# Patient Record
Sex: Male | Born: 1960 | Race: Black or African American | Hispanic: No | Marital: Single | Smoking: Never smoker
Health system: Southern US, Community
[De-identification: ages and names within clinical notes are randomized; demographics above are authoritative.]

## PROBLEM LIST (undated history)

## (undated) DIAGNOSIS — F209 Schizophrenia, unspecified: Secondary | ICD-10-CM

---

## 2003-12-07 ENCOUNTER — Emergency Department (HOSPITAL_COMMUNITY): Admission: EM | Admit: 2003-12-07 | Discharge: 2003-12-08 | Payer: Self-pay | Admitting: Emergency Medicine

## 2004-06-04 ENCOUNTER — Emergency Department (HOSPITAL_COMMUNITY): Admission: EM | Admit: 2004-06-04 | Discharge: 2004-06-04 | Payer: Self-pay | Admitting: Family Medicine

## 2007-09-16 ENCOUNTER — Ambulatory Visit: Payer: Self-pay | Admitting: Family Medicine

## 2011-08-24 ENCOUNTER — Emergency Department: Payer: Self-pay | Admitting: Emergency Medicine

## 2011-08-24 ENCOUNTER — Emergency Department: Payer: Self-pay | Admitting: *Deleted

## 2011-10-08 ENCOUNTER — Emergency Department: Payer: Self-pay | Admitting: Emergency Medicine

## 2012-10-23 IMAGING — CR DG CHEST 2V
1 series · 2 of 2 positions shown · non-contrast
Comparison: none

REASON FOR EXAM: cough
COMMENTS:

[Series 1: w chest pa · 0.14mm/px · 2 of 2 slices shown]
[im 1/2]
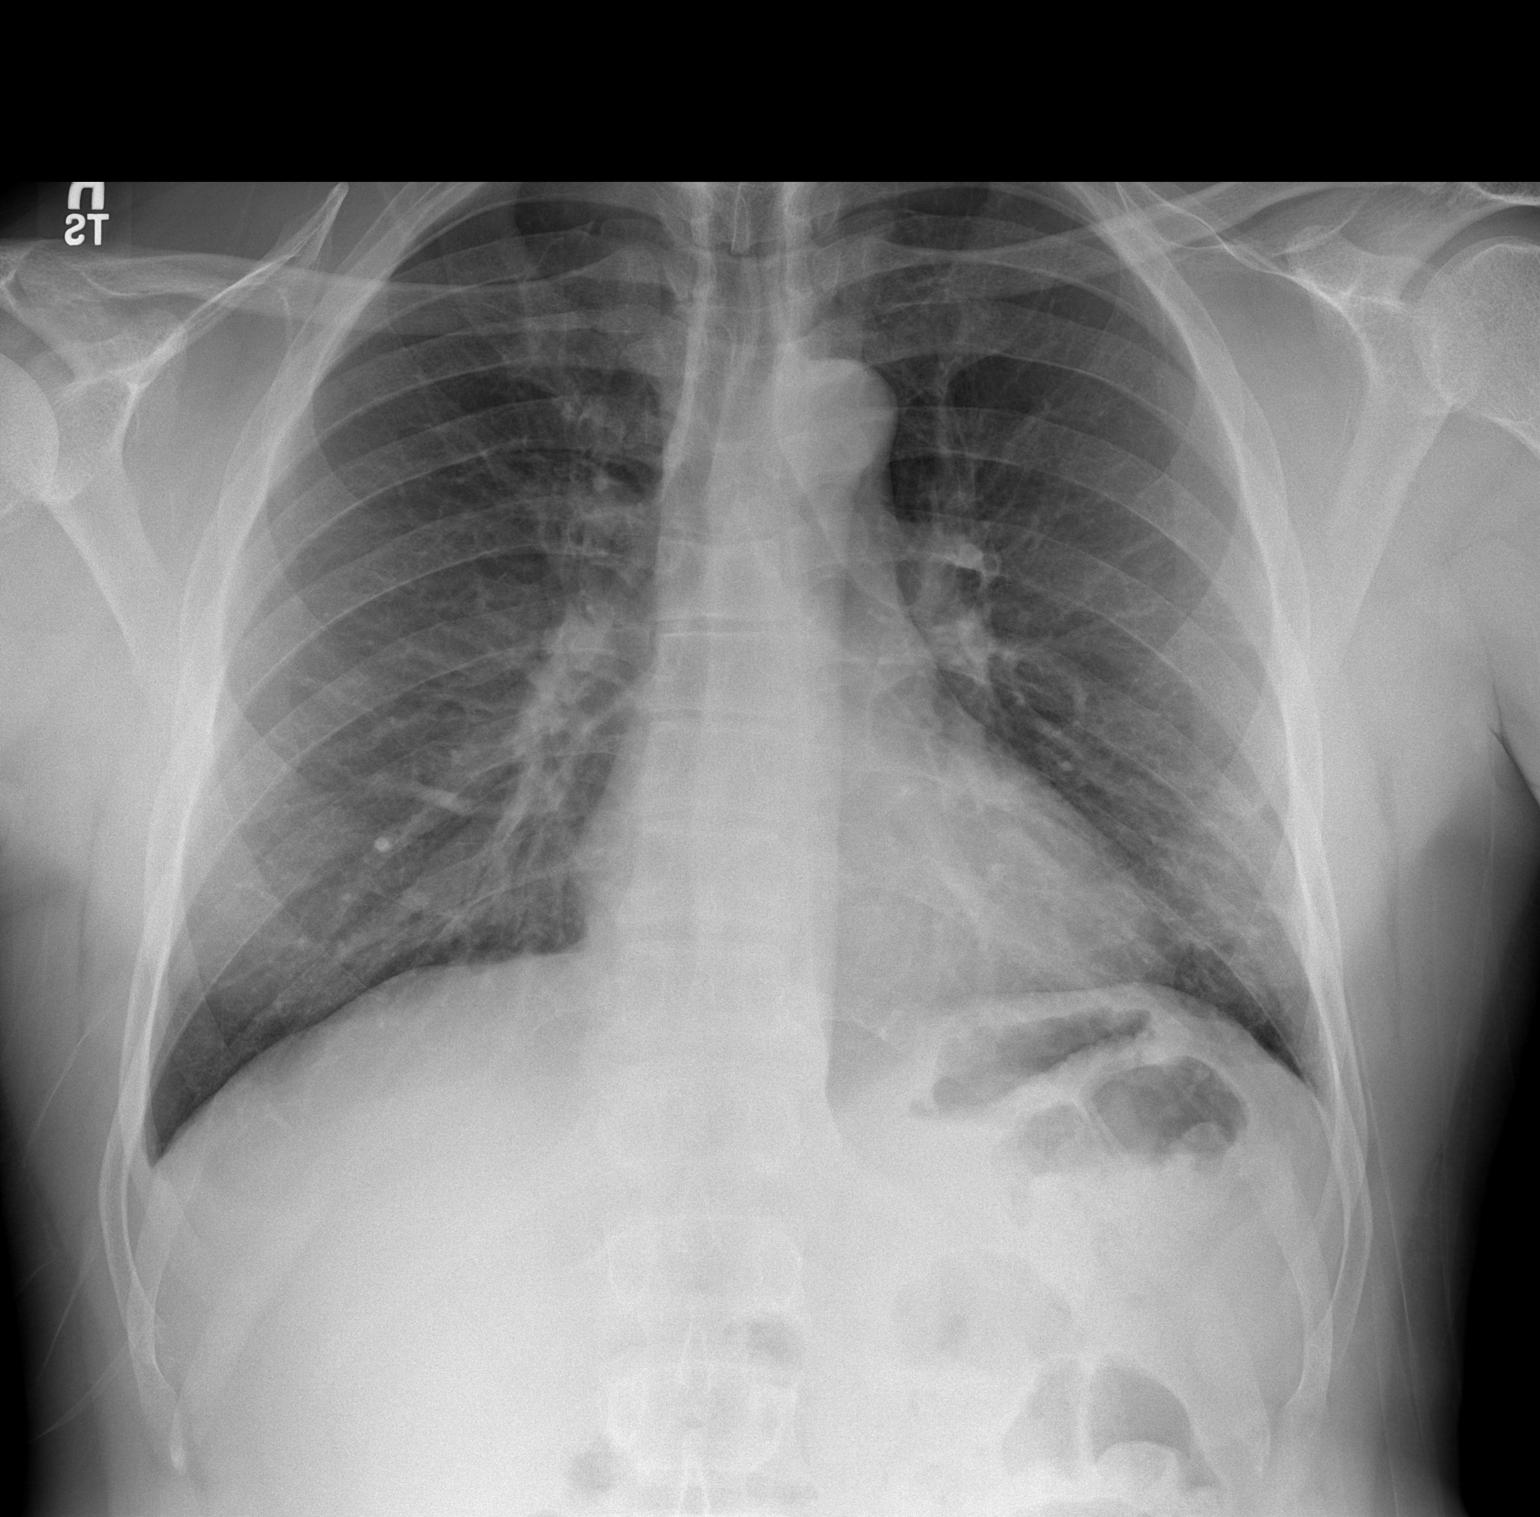
[im 2/2]
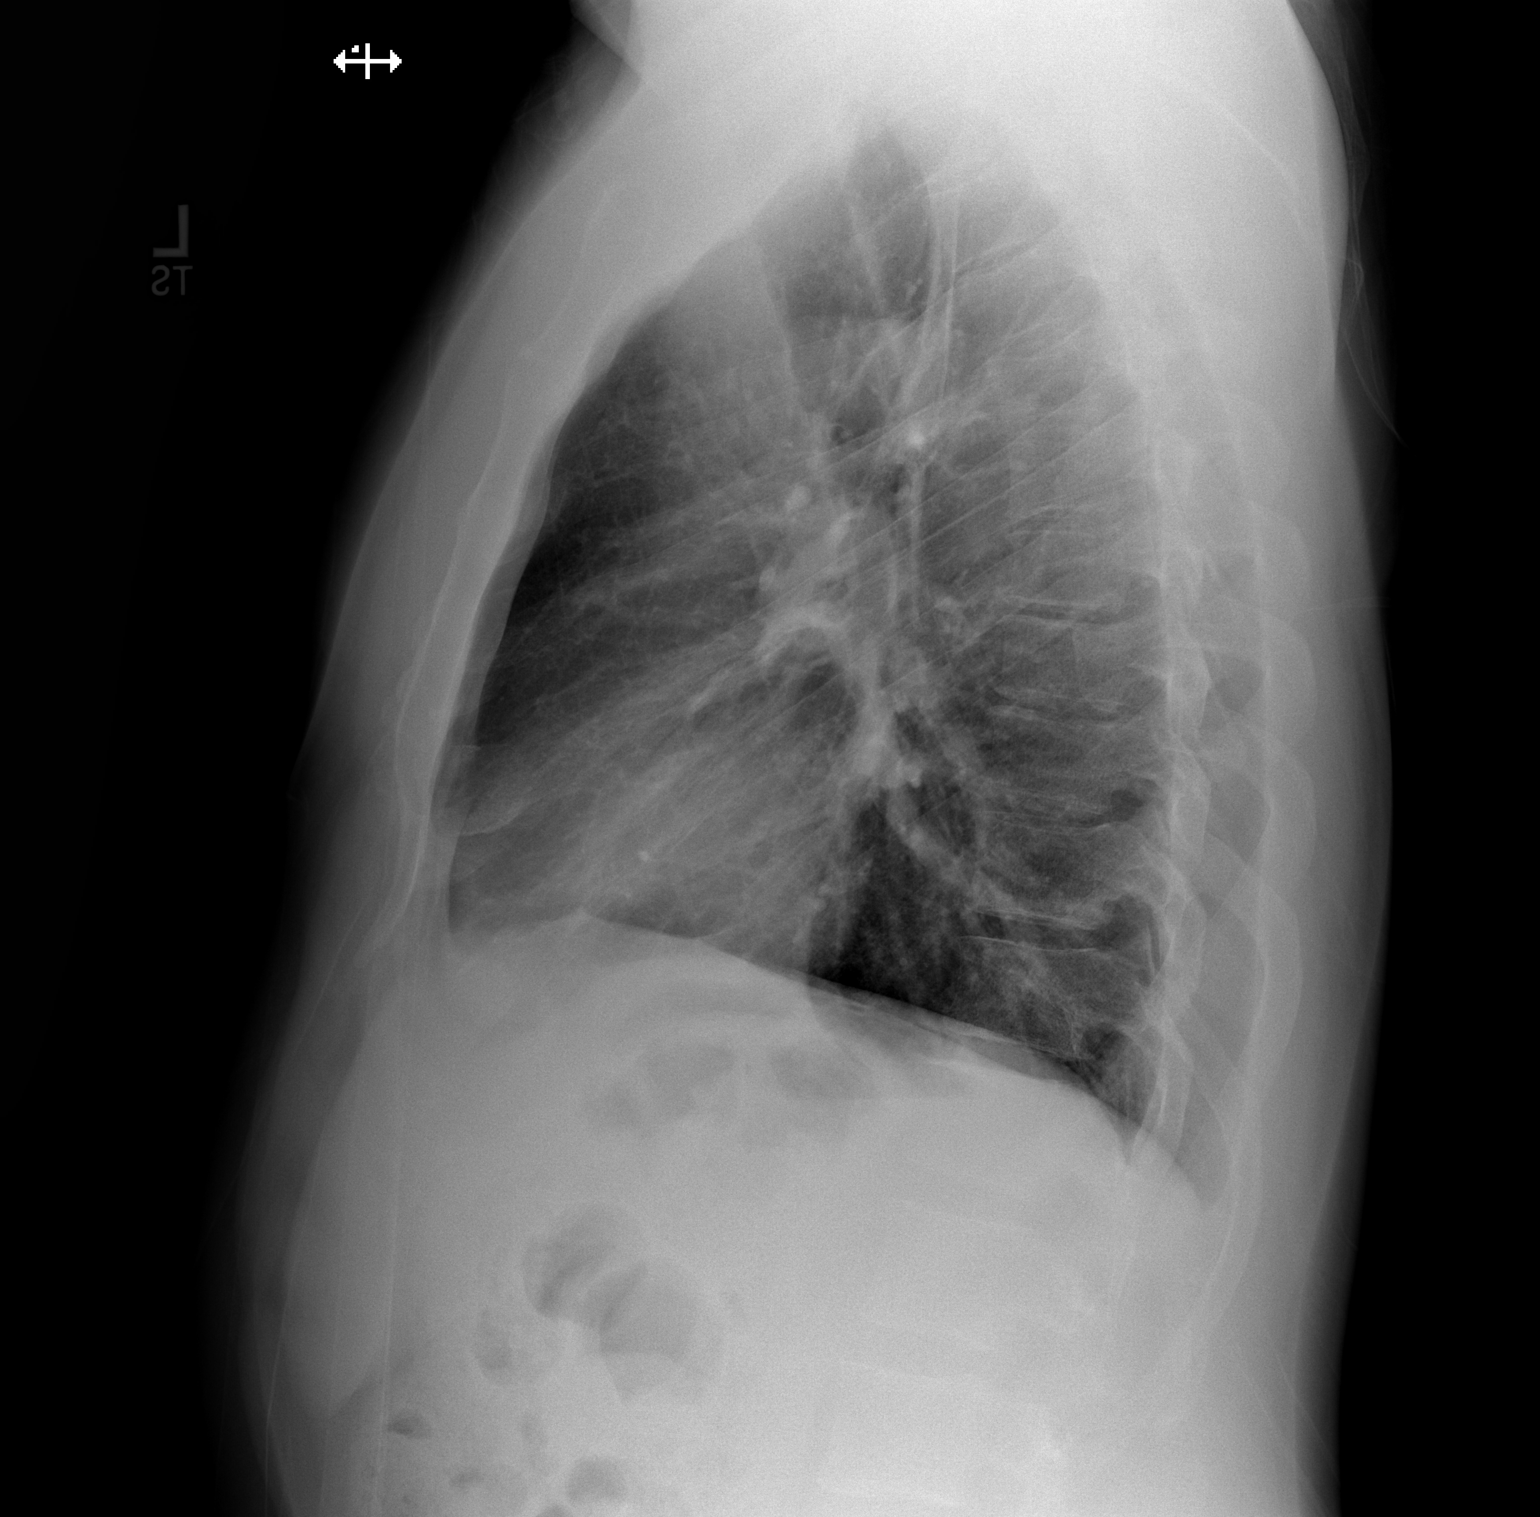

[2 of 2 positions shown; findings below may reference images not displayed]

PROCEDURE:     DXR - DXR CHEST PA (OR AP) AND LATERAL  - August 24, 2011  [DATE]

RESULT:     There is slight thickening of the left basilar markings, most
compatible with minimal discoid atelectasis. The lung fields otherwise are
clear. Heart size is normal. The mediastinal and osseous structures show no
significant abnormalities.
IMPRESSION: 1.     There is minimal atelectasis at the left base. The lung fields
otherwise are clear.

## 2017-08-17 DIAGNOSIS — D518 Other vitamin B12 deficiency anemias: Secondary | ICD-10-CM | POA: Insufficient documentation

## 2019-06-27 ENCOUNTER — Ambulatory Visit: Payer: Self-pay | Admitting: Gerontology

## 2019-06-28 ENCOUNTER — Ambulatory Visit: Payer: Medicaid Other

## 2019-07-10 ENCOUNTER — Ambulatory Visit: Payer: Self-pay | Admitting: Gerontology

## 2021-09-21 ENCOUNTER — Other Ambulatory Visit: Payer: Self-pay

## 2021-09-21 DIAGNOSIS — Z20822 Contact with and (suspected) exposure to covid-19: Secondary | ICD-10-CM | POA: Insufficient documentation

## 2021-09-21 DIAGNOSIS — F1420 Cocaine dependence, uncomplicated: Secondary | ICD-10-CM | POA: Insufficient documentation

## 2021-09-21 DIAGNOSIS — F1020 Alcohol dependence, uncomplicated: Secondary | ICD-10-CM | POA: Insufficient documentation

## 2021-09-21 DIAGNOSIS — Y900 Blood alcohol level of less than 20 mg/100 ml: Secondary | ICD-10-CM | POA: Insufficient documentation

## 2021-09-21 LAB — CBC
HCT: 39.7 % (ref 39.0–52.0)
Hemoglobin: 13.1 g/dL (ref 13.0–17.0)
MCH: 31.2 pg (ref 26.0–34.0)
MCHC: 33 g/dL (ref 30.0–36.0)
MCV: 94.5 fL (ref 80.0–100.0)
Platelets: 177 10*3/uL (ref 150–400)
RBC: 4.2 MIL/uL — ABNORMAL LOW (ref 4.22–5.81)
RDW: 14.1 % (ref 11.5–15.5)
WBC: 4.9 10*3/uL (ref 4.0–10.5)
nRBC: 0 % (ref 0.0–0.2)

## 2021-09-21 LAB — URINE DRUG SCREEN, QUALITATIVE (ARMC ONLY)
Amphetamines, Ur Screen: NOT DETECTED
Barbiturates, Ur Screen: NOT DETECTED
Benzodiazepine, Ur Scrn: NOT DETECTED
Cannabinoid 50 Ng, Ur ~~LOC~~: NOT DETECTED
Cocaine Metabolite,Ur ~~LOC~~: POSITIVE — AB
MDMA (Ecstasy)Ur Screen: NOT DETECTED
Methadone Scn, Ur: NOT DETECTED
Opiate, Ur Screen: NOT DETECTED
Phencyclidine (PCP) Ur S: NOT DETECTED
Tricyclic, Ur Screen: NOT DETECTED

## 2021-09-21 LAB — COMPREHENSIVE METABOLIC PANEL
ALT: 28 U/L (ref 0–44)
AST: 45 U/L — ABNORMAL HIGH (ref 15–41)
Albumin: 3.1 g/dL — ABNORMAL LOW (ref 3.5–5.0)
Alkaline Phosphatase: 58 U/L (ref 38–126)
Anion gap: 5 (ref 5–15)
BUN: 16 mg/dL (ref 6–20)
CO2: 26 mmol/L (ref 22–32)
Calcium: 8.5 mg/dL — ABNORMAL LOW (ref 8.9–10.3)
Chloride: 110 mmol/L (ref 98–111)
Creatinine, Ser: 1.03 mg/dL (ref 0.61–1.24)
GFR, Estimated: >60 mL/min (ref >60)
Glucose, Bld: 113 mg/dL — ABNORMAL HIGH (ref 70–99)
Potassium: 4.1 mmol/L (ref 3.5–5.1)
Sodium: 141 mmol/L (ref 135–145)
Total Bilirubin: 0.6 mg/dL (ref 0.3–1.2)
Total Protein: 5.3 g/dL — ABNORMAL LOW (ref 6.5–8.1)

## 2021-09-21 LAB — ACETAMINOPHEN LEVEL: Acetaminophen (Tylenol), Serum: <10 ug/mL — ABNORMAL LOW (ref 10–30)

## 2021-09-21 LAB — ETHANOL: Alcohol, Ethyl (B): <10 mg/dL (ref <10)

## 2021-09-21 LAB — SALICYLATE LEVEL: Salicylate Lvl: <7 mg/dL — ABNORMAL LOW (ref 7.0–30.0)

## 2021-09-21 NOTE — ED Triage Notes (Signed)
Pt to ED for detox from ETOH and cocaine.  States last use for both was last night at 2000. Pt in NAD. Denies n/v/d, tremors, anxiety, agitation, headache.   Denies SI.HI

## 2021-09-21 NOTE — ED Provider Notes (Signed)
Emergency Medicine Provider Triage Evaluation Note  Jeremy [name] , a 61 y.o. male  was evaluated in triage.  Pt requesting detox from alcohol and cocaine.  Denies withdrawal symptoms.  Review of Systems  Positive:  Negative: No chest pain, chest tightness or abdominal pain.   Physical Exam  There were no vitals taken for this visit. Gen:   Awake, no distress   Resp:  Normal effort  MSK:   Moves extremities without difficulty  Other:    Medical Decision Making  Medically screening exam initiated at 6:20 PM.  Appropriate orders placed.  Jeremy [name] was informed that the remainder of the evaluation will be completed by another provider, this initial triage assessment does not replace that evaluation, and the importance of remaining in the ED until their evaluation is complete.     Pia Mau [place], PA-C 09/21/21 Vallery Sa, MD 09/21/21 [phone]

## 2021-09-22 ENCOUNTER — Emergency Department
Admission: EM | Admit: 2021-09-22 | Discharge: 2021-09-23 | Disposition: A | Discharge status: Home / Self Care | Payer: Self-pay | Attending: Emergency Medicine | Admitting: Emergency Medicine

## 2021-09-22 DIAGNOSIS — F1410 Cocaine abuse, uncomplicated: Secondary | ICD-10-CM

## 2021-09-22 DIAGNOSIS — K029 Dental caries, unspecified: Secondary | ICD-10-CM

## 2021-09-22 LAB — RESP PANEL BY RT-PCR (FLU A&B, COVID) ARPGX2
Influenza A by PCR: NEGATIVE
Influenza B by PCR: NEGATIVE
SARS Coronavirus 2 by RT PCR: NEGATIVE

## 2021-09-22 MED ORDER — THIAMINE HCL 100 MG PO TABS
100.0000 mg | ORAL_TABLET | Freq: Every day | ORAL | Status: DC
  Administered 2021-09-23: 100 mg via ORAL
  Filled 2021-09-22: qty 1

## 2021-09-22 MED ORDER — ONDANSETRON 4 MG PO TBDP
4.0000 mg | ORAL_TABLET | Freq: Once | ORAL | Status: AC
  Administered 2021-09-22: 4 mg via ORAL
  Filled 2021-09-22: qty 1

## 2021-09-22 MED ORDER — LORAZEPAM 2 MG PO TABS: 0.0000 mg | ORAL_TABLET | Freq: Four times a day (QID) | ORAL | Status: DC

## 2021-09-22 MED ORDER — IBUPROFEN 400 MG PO TABS
400.0000 mg | ORAL_TABLET | Freq: Once | ORAL | Status: AC
Start: 2021-09-22 — End: 2021-09-22
  Administered 2021-09-22: 400 mg via ORAL
  Filled 2021-09-22: qty 1

## 2021-09-22 NOTE — ED Notes (Signed)
Pt c/o tooth pain that has returned.  EDP made aware.  Verbal order given to reorder PRN medication.
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
## 2021-09-22 NOTE — ED Provider Notes (Signed)
[facility] Provider Note    Event Date/Time   First MD Initiated Contact with Patient 09/22/21 [phone]     (approximate)   History   Detox   HPI  Jeremy [name] is a 61 y.o. male who presents to the ED from home seeking detox from alcohol and cocaine.  Reports he used both last night approximately 8 PM.  Denies active SI/HI/AH/VH.  Denies fever, cough, chest pain, shortness of breath, abdominal pain, nausea, vomiting or diarrhea.     Past Medical History  History reviewed. No pertinent past medical history.   Active Problem List  There are no problems to display for this patient.    Past Surgical History  History reviewed. No pertinent surgical history.   Home Medications   Prior to Admission medications   Not on File     Allergies  Patient has no allergy information on record.   Family History  No family history on file.   Physical Exam  Triage Vital Signs: ED Triage Vitals [09/21/21 1822]  Enc Vitals Group     BP 116/67     Pulse Rate 73     Resp 18     Temp 98.5 F (36.9 C)     Temp Source Oral     SpO2 96 %     Weight 217 lb (98.4 kg)     Height 5\' 9"  (1.753 m)     Head Circumference      Peak Flow      Pain Score 0     Pain Loc      Pain Edu?      Excl. in GC?     Updated Vital Signs: BP (!) 100/55 (BP Location: Right Arm)    Pulse 67    Temp 98 F (36.7 C) (Oral)    Resp 18    Ht 5\' 9"  (1.753 m)    Wt 98.4 kg    SpO2 97%    BMI 32.05 kg/m    General: Awake, no distress.  CV:  Good peripheral perfusion.  Resp:  Normal effort.  Abd:  No distention.  Other:  Normal affect.   ED Results / Procedures / Treatments  Labs (all labs ordered are listed, but only abnormal results are displayed) Labs Reviewed  COMPREHENSIVE METABOLIC PANEL - Abnormal; Notable for the following components:      Result Value   Glucose, Bld 113 (*)    Calcium 8.5 (*)    Total Protein 5.3 (*)    Albumin 3.1 (*)    AST 45 (*)     All other components within normal limits  SALICYLATE LEVEL - Abnormal; Notable for the following components:   Salicylate Lvl <7.0 (*)    All other components within normal limits  ACETAMINOPHEN LEVEL - Abnormal; Notable for the following components:   Acetaminophen (Tylenol), Serum <10 (*)    All other components within normal limits  CBC - Abnormal; Notable for the following components:   RBC 4.20 (*)    All other components within normal limits  URINE DRUG SCREEN, QUALITATIVE ([facility] ONLY) - Abnormal; Notable for the following components:   Cocaine Metabolite,Ur [place] POSITIVE (*)    All other components within normal limits  ETHANOL     EKG  None   RADIOLOGY None   Official radiology report(s): No results found.   PROCEDURES:  Critical Care performed: No  Procedures   MEDICATIONS ORDERED IN ED: Medications  LORazepam (ATIVAN)  tablet 0-4 mg (0 mg Oral Hold 09/22/21 0607)  thiamine tablet 100 mg (has no administration in time range)     IMPRESSION / MDM / ASSESSMENT AND PLAN / ED COURSE  I reviewed the triage vital signs and the nursing notes.                              61 year old male seeking detox from alcohol and cocaine.  He is not actively suicidal or homicidal.  Voices no medical complaints.  Laboratory results demonstrate cocaine metabolites, unremarkable EtOH.  Will place on CIWA protocol and consult TTS to evaluate patient in the emergency department.   Clinical Course as of 09/22/21 0625  Tue Sep 22, 2021  0624 TTS evaluated patient overnight and have referred him to [facility]. [JS]    Clinical Course User Index [JS] Irean Hong, MD     FINAL CLINICAL IMPRESSION(S) / ED DIAGNOSES   Final diagnoses:  Cocaine abuse (HCC)     Rx / DC Orders   ED Discharge Orders     None        Note:  This document was prepared using Dragon voice recognition software and may include unintentional dictation errors.   Irean Hong, MD 09/22/21  [phone]

## 2021-09-22 NOTE — ED Notes (Signed)
Pt requesting detox.  Pt states he does all kind of drugs.  Pt denies SI or HI.  Pt reports being homeless.  Pt in a hallway recliner.  Pt calm and cooperative.

## 2021-09-22 NOTE — ED Notes (Signed)
Breakfast tray given. °

## 2021-09-22 NOTE — BH Assessment (Signed)
Writer received call from [facility] stating they do not have any male beds available at this time.

## 2021-09-22 NOTE — ED Notes (Signed)
Voluntary detox

## 2021-09-22 NOTE — BH Assessment (Signed)
Referral information for detox/substance abuse treatment faxed to:  [facility] ([phone]-or- [phone]),   [facility] ([phone])  [facility] ([phone])  [facility] ([phone])  [facility] ([phone])    [facility] ([phone]) 

## 2021-09-22 NOTE — ED Notes (Signed)
Pt refusing Ativan at this time.  C/O tooth pain and nausea.  EDP made aware. Verbal orders given.

## 2021-09-22 NOTE — BH Assessment (Signed)
Writer spoke with patient regarding his disposition. Patient states he is needing placement for housing and does not want inpatient treatment because he wants to continue working. Patient is seeking sober living home where he can work and stay in a substance free environment. Patient needs TOC consult.

## 2021-09-22 NOTE — BH Assessment (Deleted)
Comprehensive Clinical Assessment (CCA) Note  09/22/2021 Jeremy [name] [id number]  Recommendations for Services/Supports/Treatments: Consulted with Lynder Parents., NP, who determined that pt meets inpatient psychiatric criteria. Notified Dr. Alfred Levins and Amy, RN of disposition recommendation.   Jeremy [name] is a 61 year old, English speaking, Black male with a PMH of depression due to B12 deficiency anemia and anxiety. Pt also has a hx of substance induced mood disorder, alcohol and cocaine use disorder. Pt presented to [facility] ED by BPD under IVC due to endorsing AV/H and medication noncompliance. Upon assessment, pt was standing at the door and staring intensely. Pt complied with directives to have a seat on the bed. Pt presented with I'm here for safety; I got caught doing something, and I couldn't defend myself. Pt was preoccupied throughout the assessment and asked repetitively, Is everything ok? Are you sure? Pt was paranoid and suspicious of the security guards as well as the psych team. Pt identified his main stressors as ongoing issues with his mental health in the context of hearing and seeing things. Pt reported having a hx of PTSD and TBI connected to combat. Pt explained that he is homeless and receives disability. Pt had fair insight and poor judgement. Pt is noncompliant with psychiatric medications. Pt's protective factors are being willing to ask for help. Pt's thoughts were relevant and coherent; however, pt had thought blocking and appeared to be responding to internal stimuli Pt's UDS is positive for cocaine. Pt's mood was labile and irritable; affect is blunted. Pt denied current SI and HI. Of note, pt reported having family but having no social supports   Chief Complaint: No chief complaint on file.  Visit Diagnosis: Cocaine use disorder, severe  CCA Screening, Triage and Referral (STR)  Patient Reported Information How did you hear about [place]? Self  Referral name: No data  recorded Referral phone number: No data recorded  Whom do you see for routine medical problems? No data recorded Practice/Facility Name: No data recorded Practice/Facility Phone Number: No data recorded Name of Contact: No data recorded Contact Number: No data recorded Contact Fax Number: No data recorded Prescriber Name: No data recorded Prescriber Address (if known): No data recorded  What Is the Reason for Your Visit/Call Today? Psychosis  How Long Has This Been Causing You Problems? > than 6 months  What Do You Feel Would Help You the Most Today? Treatment for Depression or other mood problem; Medication(s)   Have You Recently Been in Any Inpatient Treatment (Hospital/Detox/Crisis Center/28-Day Program)? No data recorded Name/Location of Program/Hospital:No data recorded How Long Were You There? No data recorded When Were You Discharged? No data recorded  Have You Ever Received Services From [facility] Before? No data recorded Who Do You See at [facility]? No data recorded  Have You Recently Had Any Thoughts About Hurting Yourself? No  Are You Planning to Commit Suicide/Harm Yourself At This time? No   Have you Recently Had Thoughts About [place]? No  Explanation: No data recorded  Have You Used Any Alcohol or Drugs in the Past 24 Hours? No  How Long Ago Did You Use Drugs or Alcohol? No data recorded What Did You Use and How Much? No data recorded  Do You Currently Have a Therapist/Psychiatrist? No  Name of Therapist/Psychiatrist: No data recorded  Have You Been Recently Discharged From Any Office Practice or Programs? No  Explanation of Discharge From Practice/Program: No data recorded    CCA Screening Triage Referral Assessment Type of  Contact: Face-to-Face  Is this Initial or Reassessment? No data recorded Date Telepsych consult ordered in [facility]:  No data recorded Time Telepsych consult ordered in [facility]:  No data recorded  Patient Reported  Information Reviewed? No data recorded Patient Left Without Being Seen? No data recorded Reason for Not Completing Assessment: No data recorded  Collateral Involvement: None provided   Does Patient Have a [place]? No data recorded Name and Contact of Legal Guardian: No data recorded If Minor and Not Living with Parent(s), Who has Custody? n/a  Is CPS involved or ever been involved? Never  Is APS involved or ever been involved? Never   Patient Determined To Be At Risk for Harm To Self or Others Based on Review of Patient Reported Information or Presenting Complaint? Yes, for Harm to Others  Method: No Plan  Availability of Means: No access or NA  Intent: Vague intent or NA  Notification Required: No need or identified person  Additional Information for Danger to Others Potential: Active psychosis  Additional Comments for Danger to Others Potential: Pt exhibits extreme paranoia and endorsed hearing voices that tell him things about people's bad personalities.  Are There Guns or Other Weapons in [place]? No  Types of Guns/Weapons: No data recorded Are These Weapons Safely Secured?                            No data recorded Who Could Verify You Are Able To Have These Secured: No data recorded Do You Have any Outstanding Charges, Pending Court Dates, Parole/Probation? Pt reported being caught doing something; however pt was hard to follow.  Contacted To Inform of Risk of Harm To Self or Others: No data recorded  Location of Assessment: [facility] ED   Does Patient Present under Involuntary Commitment? Yes  IVC Papers Initial File Date: 09/21/21   [place] of Residence: [place]   Patient Currently Receiving the Following Services: No data recorded  Determination of Need: Emergent (2 hours)   Options For Referral: Inpatient Hospitalization     CCA Biopsychosocial Intake/Chief Complaint:  No data recorded Current Symptoms/Problems: No data  recorded  Patient Reported Schizophrenia/Schizoaffective Diagnosis in Past: No   Strengths: Pt is able to ask for help.  Preferences: No data recorded Abilities: No data recorded  Type of Services Patient Feels are Needed: No data recorded  Initial Clinical Notes/Concerns: No data recorded  Mental Health Symptoms Depression:   None   Duration of Depressive symptoms: No data recorded  Mania:   N/A   Anxiety:    Worrying; Tension   Psychosis:   Hallucinations; Delusions   Duration of Psychotic symptoms:  Greater than six months   Trauma:   Difficulty staying/falling asleep; Hypervigilance   Obsessions:   Recurrent & persistent thoughts/impulses/images; Cause anxiety; Intrusive/time consuming   Compulsions:   N/A   Inattention:   None   Hyperactivity/Impulsivity:   None   Oppositional/Defiant Behaviors:   N/A   Emotional Irregularity:   Potentially harmful impulsivity; Recurrent suicidal behaviors/gestures/threats; Transient, stress-related paranoia/disassociation; Mood lability   Other Mood/Personality Symptoms:  No data recorded   Mental Status Exam Appearance and self-care  Stature:   Tall   Weight:   Average weight   Clothing:   -- (In scrubs)   Grooming:   Normal   Cosmetic use:   None   Posture/gait:   Normal   Motor activity:   Not Remarkable   Sensorium  Attention:   Distractible   Concentration:   Focuses on irrelevancies; Anxiety interferes; Scattered   Orientation:   Object; Person; Place; Situation   Recall/memory:   Normal   Affect and Mood  Affect:   Blunted   Mood:   Irritable   Relating  Eye contact:   Staring   Facial expression:   Tense   Attitude toward examiner:   Suspicious   Thought and Language  Speech flow:  Clear and Coherent   Thought content:   Suspicious   Preoccupation:   None   Hallucinations:   Auditory; Visual   Organization:  No data recorded  [employer] of Knowledge:   Average   Intelligence:   Average   Abstraction:   Concrete   Judgement:   Impaired   Reality Testing:   Distorted   Insight:   Fair   Decision Making:   Only simple   Social Functioning  Social Maturity:   Isolates   Social Judgement:   Impropriety   Stress  Stressors:   Housing (Current mental illness)   Coping Ability:   Exhausted   Skill Deficits:   Environmental health practitioner; Self-care   Supports:   Support needed     Religion: Religion/Spirituality Are You A Religious Person?:  (Not assessed) How Might This Affect Treatment?: n/a  Leisure/Recreation: Leisure / Recreation Do You Have Hobbies?: No  Exercise/Diet: Exercise/Diet Do You Exercise?: No Have You Gained or Lost A Significant Amount of Weight in the Past Six Months?: No Do You Follow a Special Diet?: No Do You Have Any Trouble Sleeping?: Yes Explanation of Sleeping Difficulties: Pt reported having distractions impede his ability to sleep.   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  Childhood History:  Childhood History By whom was/is the patient raised?: Mother Did patient suffer any verbal/emotional/physical/sexual abuse as a child?: No Has patient ever been sexually abused/assaulted/raped as an adolescent or adult?: No Was the patient ever a victim of a crime or a disaster?: No Witnessed domestic violence?: No Has patient been affected by domestic violence as an adult?: No  Child/Adolescent Assessment:     CCA Substance Use Alcohol/Drug Use: Alcohol / Drug Use Pain Medications: See MAR Prescriptions: See MAR Over the Counter: See MAR History of alcohol / drug use?: Yes Longest period of sobriety (when/how long): Unknown Negative Consequences of Use: Financial, Personal relationships, Legal Withdrawal Symptoms: None                         ASAM's:  Six Dimensions of Multidimensional Assessment  Dimension 1:  Acute Intoxication and/or Withdrawal Potential:   Dimension 1:  Description of  individual's past and current experiences of substance use and withdrawal: Pt has a hx of chronic cocaine abuse  Dimension 2:  Biomedical Conditions and Complications:   Dimension 2:  Description of patient's biomedical conditions and  complications: n/a  Dimension 3:  Emotional, Behavioral, or Cognitive Conditions and Complications:  Dimension 3:  Description of emotional, behavioral, or cognitive conditions and complications: Pt has a hx of PTSD and TBI  Dimension 4:  Readiness to Change:     Dimension 5:  Relapse, Continued use, or Continued Problem Potential:     Dimension 6:  Recovery/Living Environment:     ASAM Severity Score: ASAM's Severity Rating Score: 12  ASAM Recommended Level of Treatment:     Substance use Disorder (SUD) Substance Use Disorder (SUD)  Checklist Symptoms of Substance Use: Continued use despite having a persistent/recurrent physical/psychological problem caused/exacerbated by  use, Continued use despite persistent or recurrent social, interpersonal problems, caused or exacerbated by use, Recurrent use that results in a failure to fulfill major role obligations (work, school, home), Social, occupational, recreational activities given up or reduced due to use  Recommendations for Services/Supports/Treatments: Recommendations for Services/Supports/Treatments Recommendations For Services/Supports/Treatments: Individual Therapy  DSM5 Diagnoses: There are no problems to display for this patient.     [place]

## 2021-09-22 NOTE — ED Notes (Addendum)
Pt c/o jaw and mouth pain. MD messaged.

## 2021-09-23 MED ORDER — PENICILLIN V POTASSIUM 250 MG PO TABS
500.0000 mg | ORAL_TABLET | Freq: Four times a day (QID) | ORAL | Status: DC
  Administered 2021-09-23 (×2): 500 mg via ORAL
  Filled 2021-09-23: qty 2

## 2021-09-23 MED ORDER — IBUPROFEN 600 MG PO TABS
ORAL_TABLET | ORAL | Status: AC
  Filled 2021-09-23: qty 1

## 2021-09-23 MED ORDER — IBUPROFEN 800 MG PO TABS
800.0000 mg | ORAL_TABLET | Freq: Four times a day (QID) | ORAL | Status: DC | PRN
  Administered 2021-09-23: 800 mg via ORAL

## 2021-09-23 MED ORDER — PENICILLIN V POTASSIUM 250 MG PO TABS
ORAL_TABLET | ORAL | Status: AC
  Filled 2021-09-23: qty 1

## 2021-09-23 MED ORDER — IBUPROFEN 800 MG PO TABS
ORAL_TABLET | ORAL | Status: AC
  Filled 2021-09-23: qty 1

## 2021-09-23 NOTE — ED Notes (Signed)
Breafast tray given. Warm blanket provided. No other needs found.

## 2021-09-23 NOTE — ED Notes (Signed)
Pt requesting pain medication due to toothache, left side. EDP at bedside to assess.

## 2021-09-23 NOTE — ED Notes (Signed)
Given resources by case management. Pt took all of belongings and ambulated to lobby. Instructed that discharge papers are not ready at this time, and updated EDP. Pt states that he is not going to wait around on them. Pt announced leaving.

## 2021-09-23 NOTE — ED Provider Notes (Addendum)
Today's Vitals   09/22/21 0042 09/22/21 0325 09/22/21 1239 09/23/21 0230  BP: (!) 100/55  128/73 128/73  Pulse: 67  79 74  Resp: 18  18   Temp: 98 F (36.7 C)  98.2 F (36.8 C)   TempSrc: Oral  Oral   SpO2: 97%  97%   Weight:      Height:      PainSc:  0-No pain 7     Body mass index is 32.05 kg/m.   Patient resting comfortably.  No acute events overnight.  Awaiting social work disposition.  Patient initially requesting detox but does not want inpatient treatment per counselor note yesterday.  Seeking sober living home.  Reportedly needs placement for housing.   Alisha Bacus, Layla Maw, DO 09/23/21 9735   5:35 AM  Pt complaining of left lower dental pain.  Has multiple dental caries on exam without sign of drainable abscess.  No sign of facial cellulitis, Ludwig's angina.  Normal phonation.  No trismus or drooling.  We will start him on prophylactic antibiotics and I have ordered a standing order for ibuprofen given this appears to have helped his pain previously.  If discharged, patient will need a prescription for antibiotics.  Dental resources have been placed in his paperwork already.   Brailyn Killion, Layla Maw, DO 09/23/21 [phone]

## 2021-09-23 NOTE — TOC Transition Note (Signed)
Transition of Care [facility]) - CM/SW Discharge Note   Patient Details  Name: Jeremy [name] MRN: [id number] Date of Birth: [birth year]  [facility] CM/SW Contact:  Shelbie Hutching, RN Phone Number: 09/23/2021, 1:07 PM   Clinical Narrative:    Patient came into the emergency department for detox from alcohol and cocaine.  RNCM met with patient at the bedside.  Patient is very pleasant and understanding.  RNCM reached out to [employer] and left a message for a return call.  RNCM also reached out to [facility], patient has already called them and is on a wait list.  Patient does not want to go to [employer] or [employer] or [facility] [employer] in [place].  He reports that he has a job in [employer] a couple days a week and needs to stay in town.  Patient declines written copy of resources, he says he knows them all. Patient will go to the [employer] today, he has a ride coming at 2.  He reports he will wait in the lobby until they arrive.     Final next level of care: Homeless Shelter Barriers to Discharge: Barriers Resolved   Patient Goals and CMS Choice Patient states their goals for this hospitalization and ongoing recovery are:: Patient was looking to get into the shelter or sober living- needs to stay in [place]      Discharge Placement                       Discharge Plan and Services In-house Referral: Clinical Social Work Discharge Planning Services: CM Consult            DME Arranged: N/A DME Agency: NA       HH Arranged: NA [place] Agency: NA        Social Determinants of Health (SDOH) Interventions     Readmission Risk Interventions No flowsheet data found.

## 2021-09-23 NOTE — Discharge Instructions (Signed)
You may alternate Tylenol 1000 mg every 6 hours as needed for pain, fever and Ibuprofen 800 mg every 8 hours as needed for pain, fever.  Please take Ibuprofen with food.  Do not take more than 4000 mg of Tylenol (acetaminophen) in a 24 hour period.  

## 2021-09-23 NOTE — ED Notes (Signed)
Hospital meal provided.  100% consumed, pt tolerated w/o complaints.  Waste discarded appropriately.   

## 2021-12-20 ENCOUNTER — Encounter: Payer: Self-pay | Admitting: Emergency Medicine

## 2021-12-20 ENCOUNTER — Emergency Department
Admission: EM | Admit: 2021-12-20 | Discharge: 2021-12-21 | Disposition: A | Discharge status: Other Acute Inpatient Hospital | Payer: 59 | Attending: Emergency Medicine | Admitting: Emergency Medicine

## 2021-12-20 ENCOUNTER — Other Ambulatory Visit: Payer: Self-pay

## 2021-12-20 DIAGNOSIS — R7989 Other specified abnormal findings of blood chemistry: Secondary | ICD-10-CM | POA: Insufficient documentation

## 2021-12-20 DIAGNOSIS — R748 Abnormal levels of other serum enzymes: Secondary | ICD-10-CM

## 2021-12-20 DIAGNOSIS — Z20822 Contact with and (suspected) exposure to covid-19: Secondary | ICD-10-CM | POA: Insufficient documentation

## 2021-12-20 DIAGNOSIS — F14951 Cocaine use, unspecified with cocaine-induced psychotic disorder with hallucinations: Secondary | ICD-10-CM | POA: Diagnosis present

## 2021-12-20 DIAGNOSIS — R440 Auditory hallucinations: Secondary | ICD-10-CM | POA: Insufficient documentation

## 2021-12-20 LAB — CBC
HCT: 47.6 % (ref 39.0–52.0)
Hemoglobin: 15.5 g/dL (ref 13.0–17.0)
MCH: 30.8 pg (ref 26.0–34.0)
MCHC: 32.6 g/dL (ref 30.0–36.0)
MCV: 94.4 fL (ref 80.0–100.0)
Platelets: 284 10*3/uL (ref 150–400)
RBC: 5.04 MIL/uL (ref 4.22–5.81)
RDW: 13.9 % (ref 11.5–15.5)
WBC: 9.4 10*3/uL (ref 4.0–10.5)
nRBC: 0 % (ref 0.0–0.2)

## 2021-12-20 LAB — ETHANOL: Alcohol, Ethyl (B): <10 mg/dL (ref <10)

## 2021-12-20 LAB — COMPREHENSIVE METABOLIC PANEL
ALT: 25 U/L (ref 0–44)
AST: 37 U/L (ref 15–41)
Albumin: 4.2 g/dL (ref 3.5–5.0)
Alkaline Phosphatase: 85 U/L (ref 38–126)
Anion gap: 11 (ref 5–15)
BUN: 16 mg/dL (ref 6–20)
CO2: 25 mmol/L (ref 22–32)
Calcium: 9.5 mg/dL (ref 8.9–10.3)
Chloride: 109 mmol/L (ref 98–111)
Creatinine, Ser: 1 mg/dL (ref 0.61–1.24)
GFR, Estimated: >60 mL/min (ref >60)
Glucose, Bld: 106 mg/dL — ABNORMAL HIGH (ref 70–99)
Potassium: 3.7 mmol/L (ref 3.5–5.1)
Sodium: 145 mmol/L (ref 135–145)
Total Bilirubin: 1.4 mg/dL — ABNORMAL HIGH (ref 0.3–1.2)
Total Protein: 7.1 g/dL (ref 6.5–8.1)

## 2021-12-20 LAB — CK
Total CK: 792 U/L — ABNORMAL HIGH (ref 49–397)
Total CK: 533 U/L — ABNORMAL HIGH (ref 49–397)

## 2021-12-20 LAB — ACETAMINOPHEN LEVEL: Acetaminophen (Tylenol), Serum: <10 ug/mL — ABNORMAL LOW (ref 10–30)

## 2021-12-20 LAB — SALICYLATE LEVEL: Salicylate Lvl: <7 mg/dL — ABNORMAL LOW (ref 7.0–30.0)

## 2021-12-20 MED ORDER — BENZTROPINE MESYLATE 1 MG PO TABS
0.5000 mg | ORAL_TABLET | Freq: Every day | ORAL | Status: DC
  Administered 2021-12-20 – 2021-12-21 (×2): 0.5 mg via ORAL
  Filled 2021-12-20 (×2): qty 1

## 2021-12-20 MED ORDER — SODIUM CHLORIDE 0.9 % IV BOLUS
1000.0000 mL | Freq: Once | INTRAVENOUS | Status: AC
Start: 2021-12-20 — End: 2021-12-20
  Administered 2021-12-20: 1000 mL via INTRAVENOUS

## 2021-12-20 MED ORDER — HALOPERIDOL 0.5 MG PO TABS
2.0000 mg | ORAL_TABLET | Freq: Two times a day (BID) | ORAL | Status: DC
  Administered 2021-12-20 – 2021-12-21 (×3): 2 mg via ORAL
  Filled 2021-12-20 (×2): qty 4
  Filled 2021-12-20: qty 1

## 2021-12-20 NOTE — ED Notes (Addendum)
Pt came in holding a wad of cash asking to buy something to eat.  Pt is shivering and is unable to hold on to his belongings.  Pt had 447.00 in cash.  4 - 100.00 bills, 1 - 20.00 bill, 1 - 10.00 bill, 2 - 5.00 bills and 7- 1.00 BILLS.  Cash was counted by patient, myself and Engineer, production, placed in cash envelope, signed by patient, myself, Engineer, production, and [employer].  Security took envelope to safe and will return with form and key.  Pt is aware that security has his money locked up and he will retrieve it upon discharge.  Envelope number is [id number] ?

## 2021-12-20 NOTE — ED Provider Notes (Signed)
? ?[facility] ?Provider Note ? ? ? Event Date/Time  ? First MD Initiated Contact with Patient 12/20/21 [phone]   ?  (approximate) ? ? ?History  ? ?Cold Exposure ? ? ?HPI ? ?Jeremy [name] is a 61 y.o. male who self-reports a prior diagnosis of schizoaffective disorder.  He presents by [employer] police voluntarily.  Reportedly he was found outside in the cold.  He states that he started walking yesterday afternoon around 4 PM from [facility] to get to [place].  He says that the reason he was doing so was that he hears voices.  He has been hearing them for a long time and he was told a year ago that he has schizoaffective disorder by doctor at [facility].  However he does not take any medications and usually it is not a problem.  However he states that over the last few weeks and especially the last few days the voices have been getting louder and they are telling him to hurt other people.  He states that he does not want to hurt anyone, however, so he decided to walk to [facility] for help. ? ?He is shivering and cold and states that his feet hurt from walking so much.  He is afraid other people want to hurt him and was exhibiting some signs of paranoia to the Designer, television/film set.  However for the emergency department staff he is calm, polite, and respectful, following instructions and acting appropriately. ? ?He denies chest pain, abdominal pain, shortness of breath, nausea, vomiting. ?  ? ? ?Physical Exam  ? ?Triage Vital Signs: ?ED Triage Vitals  ?Enc Vitals Group  ?   BP 12/20/21 0523 (!) 125/94  ?   Pulse Rate 12/20/21 0523 85  ?   Resp 12/20/21 0523 20  ?   Temp 12/20/21 0523 (!) 97.5 ?F (36.4 ?C)  ?   Temp Source 12/20/21 0523 Oral  ?   SpO2 12/20/21 0523 95 %  ?   Weight 12/20/21 0522 98.4 kg (216 lb 14.9 oz)  ?   Height 12/20/21 0522 1.753 m (5\' 9" )  ?   Head Circumference --   ?   Peak Flow --   ?   Pain Score 12/20/21 0522 10  ?   Pain Loc --   ?   Pain Edu? --   ?   Excl. in  GC? --   ? ? ?Most recent vital signs: ?Vitals:  ? 12/20/21 0730 12/20/21 0800  ?BP: 124/71 131/73  ?Pulse: 75 71  ?Resp: 11 11  ?Temp:    ?SpO2: 95% 100%  ? ? ? ?General: Awake, no distress.  ?CV:  Good peripheral perfusion.  He was very cold to the touch upon arrival from being outside but had a rectal temperature of 98.2 degrees.  He was placed on the bear hugger for comfort but has good distal perfusion and palpable peripheral pulses. ?Resp:  Normal effort.  Lungs are clear to auscultation. ?Abd:  No distention.  No tenderness to palpation of the abdomen. ?Other:  Erythema to the bottoms of the feet, no visible blisters at this time but they may develop. ? ? ?ED Results / Procedures / Treatments  ? ?Labs ?(all labs ordered are listed, but only abnormal results are displayed) ?Labs Reviewed  ?COMPREHENSIVE METABOLIC PANEL - Abnormal; Notable for the following components:  ?    Result Value  ? Glucose, Bld 106 (*)   ? Total Bilirubin 1.4 (*)   ?  All other components within normal limits  ?SALICYLATE LEVEL - Abnormal; Notable for the following components:  ? Salicylate Lvl <7.0 (*)   ? All other components within normal limits  ?ACETAMINOPHEN LEVEL - Abnormal; Notable for the following components:  ? Acetaminophen (Tylenol), Serum <10 (*)   ? All other components within normal limits  ?CK - Abnormal; Notable for the following components:  ? Total CK 792 (*)   ? All other components within normal limits  ?ETHANOL  ?CBC  ?URINE DRUG SCREEN, QUALITATIVE ([facility] ONLY)  ?CK  ? ? ? ?PROCEDURES: ? ?Critical Care performed: No ? ?Procedures ? ? ?MEDICATIONS ORDERED IN ED: ?Medications  ?sodium chloride 0.9 % bolus 1,000 mL (1,000 mLs Intravenous New Bag/Given 12/20/21 0728)  ? ? ? ?IMPRESSION / MDM / ASSESSMENT AND PLAN / ED COURSE  ?I reviewed the triage vital signs and the nursing notes. ?             ?               ? ?Differential diagnosis includes, but is not limited to, schizoaffective disorder, schizophrenia, other  nonspecific psychiatric illness, metabolic or electrolyte abnormality, rhabdomyolysis, medication or drug side effect. ? ?The patient is acting very appropriate and calm and cooperative with [place].  Although he states that he is having auditory command hallucinations, he says he does not want to harm anyone else and he wants help.  I will not place him under involuntary commitment at this time.  ? ?Standard psychiatric lab work was ordered, including urine drug screen, salicylate level, acetaminophen level, ethanol level, comprehensive metabolic panel, and CBC.  I also added on a CK given his reported degree of exertion. ? ?I reviewed his results and his CBC is normal, CMP is normal, ethanol level negative, acetaminophen level negative, salicylate level negative.  However his CK is elevated at 792.  I ordered 1 L LR IV bolus and the patient will need a recheck CK level. ? ?The patient also needs a psychiatric evaluation.  He can be medically cleared if his CK is not going up after the IV fluids. ? ?I discussed the case in person with Dr. Sidney Ace who is taking over ED care and will follow up on the CK level and on psych management recommendations. ? ? ? ? ?  ? ? ?FINAL CLINICAL IMPRESSION(S) / ED DIAGNOSES  ? ?Final diagnoses:  ?Auditory hallucinations  ?Elevated CK  ? ? ? ?Rx / DC Orders  ? ?ED Discharge Orders   ? ? None  ? ?  ? ? ? ?Note:  This document was prepared using Dragon voice recognition software and may include unintentional dictation errors. ?  ?Loleta Rose, MD ?12/20/21 [phone] ? ?

## 2021-12-20 NOTE — ED Provider Notes (Signed)
Patient signed out to me at 8 AM.  61 year old male likely with history of schizophrenia with hallucinations and walking all night.  He is pending a liter of fluid and repeat of his CK as well as psychiatry consult. ? ?After fluids CK is downtrending and not significantly elevated. ? ?Patient evaluated by psychiatry and they will hold him overnight to see how he responds to Cogentin.  Will place in the BHU. ?  ?Georga Hacking, MD ?12/20/21 1640 ? ?

## 2021-12-20 NOTE — ED Triage Notes (Signed)
Pt to ED via BPD, reports patient was found outside. Pt reports has been walking since 1600 yesterday from [facility] to [place].  ? ?Pt noted to be paranoid in triage, states people were following him up the road and states that he has thoughts about hurting him. Pt noted to be extremely cold to touch, shivering constantly in triage.  ?

## 2021-12-20 NOTE — ED Notes (Signed)
Snack and beverage given. 

## 2021-12-20 NOTE — ED Notes (Signed)
Breakfast tray at bedside 

## 2021-12-20 NOTE — Consult Note (Signed)
[facility] Face-to-Face Psychiatry Consult  ? 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 ? ?Total Time spent with patient: 45 minutes ? ?Subjective:   ?Jeremy [name] is a 61 y.o. male patient admitted with cocaine abuse with hallucinations. ? ?HPI:  61 yo male presents after his mother dropped him off in [place] for his friend to take him to [facility]  Evidently,  his friend did not come and he walked to [facility].  He reports using crack/cocaine daily about $30-$40 dollars worth and "hear people outside my window.  If I look I see people even far away.  So I don't look and don't see them."  He reports these symptoms occurring for the past year and his cocaine use being steady over the course of the past 4 months.  However, per the chart review, similar presentation at [facility] in 01/2021 with reports of using cocaine over the past year.  Denies suicidal/homicidal ideations and withdrawal symptoms.  He reported paranoia last night, none today.  Explained Haldol would be started to assist his symptoms and until he refrains from cocaine abuse his symptoms would most likely continue and/or increase.  He is agreeable to follow up with outpatient substance abuse services with [facility].  Haldol will be started and considered for discharge tomorrow after reassessment.  This plan was communicated to him. ? ?Caveat:  Court date on 4/17 for probation violation in [facility] for moving out of the county.  Resources may need to be geared to [facility] at the [facility] if this is the case.  TTS notified. ? ?Past Psychiatric History: cocaine/crack abuse  ? ?Risk to Self:  none ?Risk to Others:  none ?Prior Inpatient Therapy:  unc  ?Prior Outpatient Therapy:   none ? ?Past Medical History: History reviewed. No pertinent past medical history. History reviewed. No pertinent surgical history. ?Family History: History reviewed. No pertinent family history. ?Family Psychiatric  History: none ?Social History:  ?Social History  ? ?Substance and Sexual Activity  ?Alcohol Use Yes  ?   ?Social History  ? ?Substance and Sexual Activity  ?Drug Use Yes  ? Types: Cocaine  ?  ?Social History  ? ?Socioeconomic History  ? Marital status: Single  ?  Spouse name: Not on file  ? Number of children: Not on file  ? Years of education: Not on file  ? Highest education level: Not on file  ?Occupational History  ? Not on file  ?Tobacco Use  ? Smoking status: Never  ? Smokeless tobacco: Never  ?Substance and Sexual Activity  ? Alcohol use: Yes  ? Drug use: Yes  ?  Types: Cocaine  ? Sexual activity: Not on file  ?Other Topics Concern  ? Not on file  ?Social History Narrative  ? Not on file  ? ?Social Determinants of Health  ? ?Financial Resource Strain: Not on file  ?Food Insecurity: Not on file  ?Transportation Needs: Not on file  ?Physical Activity: Not on file  ?Stress: Not on file  ?Social Connections: Not on file  ? ?Additional Social History: ?  ? ?Allergies:  No Known Allergies ? ?Labs:  ?Results for orders placed or performed during the hospital encounter of 12/20/21 (from the past 48 hour(s))  ?Comprehensive metabolic panel     Status: Abnormal  ? Collection Time:  12/20/21  5:34 AM  ?Result Value Ref Range  ? Sodium 145 135 - 145 mmol/L  ? Potassium 3.7 3.5 - 5.1 mmol/L  ? Chloride 109 98 - 111 mmol/L  ? CO2 25 22 - 32 mmol/L  ? Glucose, Bld 106 (H) 70 - 99 mg/dL  ?  Comment: Glucose reference range applies only to samples taken after fasting for at least 8 hours.  ? BUN 16 6 - 20 mg/dL  ? Creatinine, Ser 1.00 0.61 - 1.24 mg/dL  ? Calcium 9.5 8.9 - 10.3 mg/dL  ? Total Protein 7.1 6.5 - 8.1 g/dL  ? Albumin 4.2 3.5 - 5.0 g/dL  ? AST 37 15 - 41 U/L  ? ALT 25 0 - 44 U/L  ? Alkaline Phosphatase  85 38 - 126 U/L  ? Total Bilirubin 1.4 (H) 0.3 - 1.2 mg/dL  ? GFR, Estimated >60 >60 mL/min  ?  Comment: (NOTE) ?Calculated using the CKD-EPI Creatinine Equation (2021) ?  ? Anion gap 11 5 - 15  ?  Comment: Performed at [facility], [address], [place], [place] [address]  ?Ethanol     Status: None  ? Collection Time: 12/20/21  5:34 AM  ?Result Value Ref Range  ? Alcohol, Ethyl (B) <10 <10 mg/dL  ?  Comment: (NOTE) ?Lowest detectable limit for serum alcohol is 10 mg/dL. ? ?For medical purposes only. ?Performed at [facility], [place], ?[place] [address] ?  ?Salicylate level     Status: Abnormal  ? Collection Time: 12/20/21  5:34 AM  ?Result Value Ref Range  ? Salicylate Lvl Q000111Q (L) 7.0 - 30.0 mg/dL  ?  Comment: Performed at [facility], [address] [place], [place] [address]  ?Acetaminophen level     Status: Abnormal  ? Collection Time: 12/20/21  5:34 AM  ?Result Value Ref Range  ? Acetaminophen (Tylenol), Serum <10 (L) 10 - 30 ug/mL  ?  Comment: (NOTE) ?Therapeutic concentrations vary significantly. A range of 10-30 ug/mL  ?may be an effective concentration for many patients. However, some  ?are best treated at concentrations outside of this range. ?Acetaminophen concentrations >150 ug/mL at 4 hours after ingestion  ?and >50 ug/mL at 12 hours after ingestion are often associated with  ?toxic reactions. ? ?Performed at [facility], [place], ?[place] [address] ?  ?cbc     Status: None  ? Collection Time: 12/20/21  5:34 AM  ?Result Value Ref Range  ? WBC 9.4 4.0 - 10.5 K/uL  ? RBC 5.04 4.22 - 5.81 MIL/uL  ? Hemoglobin 15.5 13.0 - 17.0 g/dL  ? HCT 47.6 39.0 - 52.0 %  ? MCV 94.4 80.0 - 100.0 fL  ? MCH 30.8 26.0 - 34.0 pg  ? MCHC 32.6 30.0 - 36.0 g/dL  ? RDW 13.9 11.5 - 15.5 %  ? Platelets 284 150 - 400 K/uL  ? nRBC 0.0 0.0 - 0.2 %  ?  Comment: Performed at [facility], [address], [place], [place] [address]  ?CK      Status: Abnormal  ? Collection Time: 12/20/21  5:34 AM  ?Result Value Ref Range  ? Total CK 792 (H) 49 - 397 U/L  ?  Comment: Performed at [facility], [address], [place], [place] [address]  ? ? ?No current facility-administered medications for this encounter.  ? ?No current outpatient medications on file.  ? ? ?Musculoskeletal: ?Strength & Muscle Tone: within  normal limits ?Gait & Station: normal ?Patient leans: N/A ? ?Psychiatric Specialty Exam: ?Physical Exam ?Vitals and nursing note reviewed.  ?Constitutional:   ?   Appearance: Normal appearance.  ?HENT:  ?   Head: Normocephalic.  ?   Nose: Nose normal.  ?Pulmonary:  ?   Effort: Pulmonary effort is normal.  ?Musculoskeletal:     ?   General: Normal range of motion.  ?   Cervical back: Normal range of motion.  ?Neurological:  ?   General: No focal deficit present.  ?   Mental Status: He is alert and oriented to person, place, and time.  ?Psychiatric:     ?   Attention and Perception: Attention normal. He perceives visual hallucinations.     ?   Mood and Affect: Mood is anxious.     ?   Speech: Speech normal.     ?   Behavior: Behavior normal. Behavior is cooperative.     ?   Thought Content: Thought content normal.     ?   Cognition and Memory: Cognition and memory normal.     ?   Judgment: Judgment normal.  ?  ?Review of Systems  ?Constitutional:  Positive for malaise/fatigue.  ?Psychiatric/Behavioral:  Positive for hallucinations and substance abuse. The patient is nervous/anxious.   ?All other systems reviewed and are negative.  ?Blood pressure 119/72, pulse 66, temperature 98.2 ?F (36.8 ?C), temperature source Rectal, resp. rate 13, height 5\' 9"  (1.753 m), weight 98.4 kg, SpO2 98 %.Body mass index is 32.04 kg/m?.  ?General Appearance: Casual  ?Eye Contact:  Good  ?Speech:  Normal Rate  ?Volume:  Normal  ?Mood:  Anxious  ?Affect:  Congruent  ?Thought Process:  Coherent and Descriptions of Associations: Intact  ?Orientation:  Full (Time, Place,  and Person)  ?Thought Content:  WDL and Logical  ?Suicidal Thoughts:  No  ?Homicidal Thoughts:  No  ?Memory:  Immediate;   Fair ?Recent;   Fair ?Remote;   Fair  ?Judgement:  Fair  ?Insight:  Fair  ?Psychomotor Marya Fossa

## 2021-12-20 NOTE — ED Notes (Signed)
Walked pt around hallway. Has pain in feet but is steady. Dressed into blue scrubs.  ?

## 2021-12-20 NOTE — ED Notes (Signed)
Patient given ginger ale, crackers, and peanut butter at this time. ?

## 2021-12-20 NOTE — ED Notes (Signed)
Report to include Situation, Background, Assessment, and Recommendations received from Annie RN. Patient alert and oriented, warm and dry, in no acute distress. Patient denies SI, HI, AVH and pain. Patient made aware of Q15 minute rounds and security cameras for their safety. Patient instructed to come to me with needs or concerns. ? ?

## 2021-12-20 NOTE — ED Notes (Signed)
Patient resting comfortably in stretcher. Nad noted. ?

## 2021-12-21 ENCOUNTER — Encounter: Payer: Self-pay | Admitting: Psychiatry

## 2021-12-21 ENCOUNTER — Inpatient Hospital Stay
Admission: AD | Admit: 2021-12-21 | Discharge: 2021-12-25 | DRG: 897 | Disposition: A | Discharge status: Home / Self Care | Payer: No Typology Code available for payment source | Source: Intra-hospital | Attending: Psychiatry | Admitting: Psychiatry

## 2021-12-21 ENCOUNTER — Other Ambulatory Visit: Payer: Self-pay

## 2021-12-21 DIAGNOSIS — F319 Bipolar disorder, unspecified: Secondary | ICD-10-CM | POA: Diagnosis present

## 2021-12-21 DIAGNOSIS — F419 Anxiety disorder, unspecified: Secondary | ICD-10-CM | POA: Diagnosis present

## 2021-12-21 DIAGNOSIS — G4700 Insomnia, unspecified: Secondary | ICD-10-CM | POA: Diagnosis present

## 2021-12-21 DIAGNOSIS — F1994 Other psychoactive substance use, unspecified with psychoactive substance-induced mood disorder: Secondary | ICD-10-CM | POA: Diagnosis present

## 2021-12-21 DIAGNOSIS — F29 Unspecified psychosis not due to a substance or known physiological condition: Secondary | ICD-10-CM

## 2021-12-21 DIAGNOSIS — Z5900 Homelessness unspecified: Secondary | ICD-10-CM | POA: Diagnosis not present

## 2021-12-21 DIAGNOSIS — F14151 Cocaine abuse with cocaine-induced psychotic disorder with hallucinations: Secondary | ICD-10-CM | POA: Diagnosis present

## 2021-12-21 DIAGNOSIS — F14951 Cocaine use, unspecified with cocaine-induced psychotic disorder with hallucinations: Secondary | ICD-10-CM | POA: Diagnosis not present

## 2021-12-21 DIAGNOSIS — F1410 Cocaine abuse, uncomplicated: Secondary | ICD-10-CM

## 2021-12-21 DIAGNOSIS — F15159 Other stimulant abuse with stimulant-induced psychotic disorder, unspecified: Secondary | ICD-10-CM | POA: Diagnosis present

## 2021-12-21 LAB — URINE DRUG SCREEN, QUALITATIVE (ARMC ONLY)
Amphetamines, Ur Screen: NOT DETECTED
Barbiturates, Ur Screen: NOT DETECTED
Benzodiazepine, Ur Scrn: NOT DETECTED
Cannabinoid 50 Ng, Ur ~~LOC~~: NOT DETECTED
Cocaine Metabolite,Ur ~~LOC~~: POSITIVE — AB
MDMA (Ecstasy)Ur Screen: NOT DETECTED
Methadone Scn, Ur: NOT DETECTED
Opiate, Ur Screen: NOT DETECTED
Phencyclidine (PCP) Ur S: NOT DETECTED
Tricyclic, Ur Screen: NOT DETECTED

## 2021-12-21 LAB — RESP PANEL BY RT-PCR (FLU A&B, COVID) ARPGX2
Influenza A by PCR: NEGATIVE
Influenza B by PCR: NEGATIVE
SARS Coronavirus 2 by RT PCR: NEGATIVE

## 2021-12-21 MED ORDER — HALOPERIDOL 1 MG PO TABS
2.0000 mg | ORAL_TABLET | Freq: Two times a day (BID) | ORAL | Status: DC
  Administered 2021-12-21 – 2021-12-25 (×9): 2 mg via ORAL
  Filled 2021-12-21 (×9): qty 2

## 2021-12-21 MED ORDER — ACETAMINOPHEN 325 MG PO TABS
650.0000 mg | ORAL_TABLET | Freq: Four times a day (QID) | ORAL | Status: DC | PRN
  Administered 2021-12-21: 650 mg via ORAL
  Filled 2021-12-21: qty 2

## 2021-12-21 MED ORDER — ALUM & MAG HYDROXIDE-SIMETH 200-200-20 MG/5ML PO SUSP
30.0000 mL | ORAL | Status: DC | PRN
Start: 2021-12-21 — End: 2021-12-25

## 2021-12-21 MED ORDER — MAGNESIUM HYDROXIDE 400 MG/5ML PO SUSP: 30.0000 mL | Freq: Every day | ORAL | Status: DC | PRN

## 2021-12-21 MED ORDER — BENZTROPINE MESYLATE 1 MG PO TABS
0.5000 mg | ORAL_TABLET | Freq: Every day | ORAL | Status: DC
  Administered 2021-12-22 – 2021-12-25 (×4): 0.5 mg via ORAL
  Filled 2021-12-21 (×4): qty 1

## 2021-12-21 NOTE — Plan of Care (Signed)
?  Problem: Education: ?Goal: Knowledge of [facility] Education information/materials will improve ?Outcome: Not Progressing ?Goal: Mental status will improve ?Outcome: Not Progressing ?Goal: Verbalization of understanding the information provided will improve ?Outcome: Not Progressing ?  ?Problem: Health Behavior/Discharge Planning: ?Goal: Identification of resources available to assist in meeting health care needs will improve ?Outcome: Not Progressing ?Goal: Compliance with treatment plan for underlying cause of condition will improve ?Outcome: Not Progressing ?  ?Problem: Coping: ?Goal: Ability to identify and develop effective coping behavior will improve ?Outcome: Not Progressing ?Goal: Ability to interact with others will improve ?Outcome: Not Progressing ?Goal: Demonstration of participation in decision-making regarding own care will improve ?Outcome: Not Progressing ?Goal: Ability to use eye contact when communicating with others will improve ?Outcome: Not Progressing ?  ?Problem: Education: ?Goal: Knowledge of disease or condition will improve ?Outcome: Not Progressing ?Goal: Understanding of discharge needs will improve ?Outcome: Not Progressing ?  ?Problem: Safety: ?Goal: Ability to remain free from injury will improve ?Outcome: Not Progressing ?  ?

## 2021-12-21 NOTE — ED Notes (Signed)
Pt given snack. 

## 2021-12-21 NOTE — Progress Notes (Signed)
Pt is a 61 yr old african [place] male who came in voluntarily. Pt was aggressive with security and staff upon arrival to the unit. Pt was able to be verbal de-escalated. Pt reports his stressor is that he is continuing to hear the same two voices, a black guy and girl. Pt states they're trying to make me crazy. Pt states he has also been seeing the same 5 cars but in different intervals following him. Pt says they (the guy and girl) are the only two with stamina to provoke me to do something. Pt stated this has only started over the last couple of years, this is new and it's not the cocaine because he's continued to use and that hasn't changed. Pt is irritable, agitated, and aggressive upon admission but did eventually calm down. Skin assessment preformed upon admission, pt has an abrasion on thumb of R hand. ?

## 2021-12-21 NOTE — ED Notes (Signed)
VOL/pending reassessment in AM 

## 2021-12-21 NOTE — Tx Team (Signed)
Initial Treatment Plan ?12/21/2021 ?6:36 PM ?Francis Dowse ?[id number] ? ? ? ?PATIENT STRESSORS: ?Medication change or noncompliance   ?Substance abuse   ? ? ?PATIENT STRENGTHS: ?Average or above average intelligence  ?Communication skills  ?Physical Health  ? ? ?PATIENT IDENTIFIED PROBLEMS: ?Substance abuse  ?Homelessness   ?  ?  ?  ?  ?  ?  ?  ?  ? ?DISCHARGE CRITERIA:  ?Ability to meet basic life and health needs ?Medical problems require only outpatient monitoring ?Verbal commitment to aftercare and medication compliance ? ?PRELIMINARY DISCHARGE PLAN: ?Attend aftercare/continuing care group ?Outpatient therapy ? ?PATIENT/FAMILY INVOLVEMENT: ?This treatment plan has been presented to and reviewed with the patient, Jeremy [name], and/or family member,  The patient and family have been given the opportunity to ask questions and make suggestions. ? ?Merlene Morse, RN ?12/21/2021, 6:36 PM ?

## 2021-12-21 NOTE — ED Notes (Signed)
Covid swab and uine sent to lab ?

## 2021-12-21 NOTE — Consult Note (Signed)
[facility] Psych ED Progress Note ? ?12/21/2021 10:26 AM ?Jeremy [name]  ?MRN:  [id number] ? ? ?Method of visit?: Face to Face  ? ?Subjective:  "Nobody believes me that it is not the cocaine." Patient is interested in getting in-patient treatment, stating that he is frustrated because he has been hearing voices for a year, and never had psychotic symptoms for all the other years he has used cocaine.  He endorses seeing people, and next time "I will just shoot my gun and if they aren't real it won't kill them."  ?He endorses paranoia that "people are after me." ? ?Principal Problem: Cocaine-induced psychotic disorder with hallucinations ([facility]) ?Diagnosis:  Principal Problem: ?  Cocaine-induced psychotic disorder with hallucinations ([facility]) ? ?Total Time spent with patient: 15 minutes ? ?Past Psychiatric History: psychosis with cocaine use ? ?Past Medical History: History reviewed. No pertinent past medical history. History reviewed. No pertinent surgical history. ?Family History: History reviewed. No pertinent family history. ?Family Psychiatric  History: unknown ?Social History:  ?Social History  ? ?Substance and Sexual Activity  ?Alcohol Use Yes  ?   ?Social History  ? ?Substance and Sexual Activity  ?Drug Use Yes  ? Types: Cocaine  ?  ?Social History  ? ?Socioeconomic History  ? Marital status: Single  ?  Spouse name: Not on file  ? Number of children: Not on file  ? Years of education: Not on file  ? Highest education level: Not on file  ?Occupational History  ? Not on file  ?Tobacco Use  ? Smoking status: Never  ? Smokeless tobacco: Never  ?Substance and Sexual Activity  ? Alcohol use: Yes  ? Drug use: Yes  ?  Types: Cocaine  ? Sexual activity: Not on file  ?Other Topics Concern  ? Not on file  ?Social History Narrative  ? Not on file  ? ?Social Determinants of Health  ? ?Financial Resource Strain: Not on file  ?Food Insecurity: Not on file  ?Transportation Needs: Not on file  ?Physical Activity: Not on file  ?Stress:  Not on file  ?Social Connections: Not on file  ? ? ?Sleep: Fair ? ?Appetite:  Good ? ?Current Medications: ?Current Facility-Administered Medications  ?Medication Dose Route Frequency Provider Last Rate Last Admin  ? benztropine (COGENTIN) tablet 0.5 mg  0.5 mg Oral Daily Charm Rings, NP   0.5 mg at 12/21/21 4268  ? haloperidol (HALDOL) tablet 2 mg  2 mg Oral BID Charm Rings, NP   2 mg at 12/21/21 3419  ? ?No current outpatient medications on file.  ? ? ?Lab Results:  ?Results for orders placed or performed during the hospital encounter of 12/20/21 (from the past 48 hour(s))  ?Comprehensive metabolic panel     Status: Abnormal  ? Collection Time: 12/20/21  5:34 AM  ?Result Value Ref Range  ? Sodium 145 135 - 145 mmol/L  ? Potassium 3.7 3.5 - 5.1 mmol/L  ? Chloride 109 98 - 111 mmol/L  ? CO2 25 22 - 32 mmol/L  ? Glucose, Bld 106 (H) 70 - 99 mg/dL  ?  Comment: Glucose reference range applies only to samples taken after fasting for at least 8 hours.  ? BUN 16 6 - 20 mg/dL  ? Creatinine, Ser 1.00 0.61 - 1.24 mg/dL  ? Calcium 9.5 8.9 - 10.3 mg/dL  ? Total Protein 7.1 6.5 - 8.1 g/dL  ? Albumin 4.2 3.5 - 5.0 g/dL  ? AST 37 15 - 41 U/L  ?  ALT 25 0 - 44 U/L  ? Alkaline Phosphatase 85 38 - 126 U/L  ? Total Bilirubin 1.4 (H) 0.3 - 1.2 mg/dL  ? GFR, Estimated >60 >60 mL/min  ?  Comment: (NOTE) ?Calculated using the CKD-EPI Creatinine Equation (2021) ?  ? Anion gap 11 5 - 15  ?  Comment: Performed at [facility], [address], [place], [place] [address]  ?Ethanol     Status: None  ? Collection Time: 12/20/21  5:34 AM  ?Result Value Ref Range  ? Alcohol, Ethyl (B) <10 <10 mg/dL  ?  Comment: (NOTE) ?Lowest detectable limit for serum alcohol is 10 mg/dL. ? ?For medical purposes only. ?Performed at [facility], [address] [place], ?[place] [address] ?  ?Salicylate level     Status: Abnormal  ? Collection Time: 12/20/21  5:34 AM  ?Result Value Ref Range  ? Salicylate Lvl <7.0 (L) 7.0 - 30.0 mg/dL   ?  Comment: Performed at [facility], [address] [place], [place] [address]  ?Acetaminophen level     Status: Abnormal  ? Collection Time: 12/20/21  5:34 AM  ?Result Value Ref Range  ? Acetaminophen (Tylenol), Serum <10 (L) 10 - 30 ug/mL  ?  Comment: (NOTE) ?Therapeutic concentrations vary significantly. A range of 10-30 ug/mL  ?may be an effective concentration for many patients. However, some  ?are best treated at concentrations outside of this range. ?Acetaminophen concentrations >150 ug/mL at 4 hours after ingestion  ?and >50 ug/mL at 12 hours after ingestion are often associated with  ?toxic reactions. ? ?Performed at [facility], [address] [place], ?[place] [address] ?  ?cbc     Status: None  ? Collection Time: 12/20/21  5:34 AM  ?Result Value Ref Range  ? WBC 9.4 4.0 - 10.5 K/uL  ? RBC 5.04 4.22 - 5.81 MIL/uL  ? Hemoglobin 15.5 13.0 - 17.0 g/dL  ? HCT 47.6 39.0 - 52.0 %  ? MCV 94.4 80.0 - 100.0 fL  ? MCH 30.8 26.0 - 34.0 pg  ? MCHC 32.6 30.0 - 36.0 g/dL  ? RDW 13.9 11.5 - 15.5 %  ? Platelets 284 150 - 400 K/uL  ? nRBC 0.0 0.0 - 0.2 %  ?  Comment: Performed at [facility], [address] [place], [place] [address]  ?CK     Status: Abnormal  ? Collection Time: 12/20/21  5:34 AM  ?Result Value Ref Range  ? Total CK 792 (H) 49 - 397 U/L  ?  Comment: Performed at [facility], [address] [place], [place] [address]  ?CK     Status: Abnormal  ? Collection Time: 12/20/21  9:46 AM  ?Result Value Ref Range  ? Total CK 533 (H) 49 - 397 U/L  ?  Comment: Performed at [facility], [address], [place], [place] [address]  ? ? ?Blood Alcohol level:  ?Lab Results  ?Component Value Date  ? ETH <10 12/20/2021  ? ETH <10 09/21/2021  ? ? ?Physical Findings: ?AIMS:  , ,  ,  ,    ?CIWA:    ?COWS:    ? ?Musculoskeletal: ?Strength & Muscle Tone: within normal limits ?Gait & Station: normal ?Patient leans: N/A ? ?Psychiatric Specialty Exam: ? ?Presentation   ?General Appearance: Casual ? ?Eye Contact:Fair ? ?Speech:Clear and Coherent ? ?Speech Volume:Normal ? ?Handedness:No data recorded ? ?Mood and Affect  ?Mood:Dysphoric ? ?Affect:Congruent ? ? ?Thought Process  ?Thought Processes:Coherent ? ?  Descriptions of Associations:Intact ? ?Orientation:Full (Time, Place and Person) ? ?Thought Content:Logical ? ?History of Schizophrenia/Schizoaffective disorder:No ? ?Duration of Psychotic Symptoms:N/A ? ?Hallucinations:Hallucinations: Visual ? ?Ideas of Reference:Paranoia ("I see people following me in cars") ? ?Suicidal Thoughts:Suicidal Thoughts: Yes, Passive ? ?Homicidal Thoughts:Homicidal Thoughts: No ? ? ?Sensorium  ?Memory:Immediate Fair ? ?Judgment:Poor ? ?Insight:No data recorded ? ?Executive Functions  ?Concentration:Fair ? ?Attention Span:Fair ? ?Recall:Fair ? ?Fund of Knowledge:Fair ? ?Language:Fair ? ? ?Psychomotor Activity  ?Psychomotor Activity:Psychomotor Activity: Normal ? ? ?Assets  ?Assets:Resilience ? ? ?Sleep  ?Sleep:No data recorded ? ? ?Physical Exam: ?Physical Exam ?Vitals reviewed.  ?HENT:  ?   Head: Normocephalic.  ?   Nose: No congestion or rhinorrhea.  ?Eyes:  ?   General:     ?   Right eye: No discharge.     ?   Left eye: No discharge.  ?Cardiovascular:  ?   Rate and Rhythm: Normal rate.  ?Pulmonary:  ?   Effort: Pulmonary effort is normal.  ?Musculoskeletal:     ?   General: Normal range of motion.  ?   Cervical back: Normal range of motion.  ?Skin: ?   General: Skin is dry.  ?Neurological:  ?   Mental Status: He is alert and oriented to person, place, and time.  ?Psychiatric:     ?   Attention and Perception: Attention normal.     ?   Mood and Affect: Mood is depressed. Affect is blunt.     ?   Speech: Speech normal.     ?   Behavior: Behavior is cooperative.     ?   Thought Content: Thought content is paranoid and delusional.  ? ?ROS ?Blood pressure 124/72, pulse 62, temperature 98.2 ?F (36.8 ?C), temperature source Oral, resp. rate 18, height 5'  9" (1.753 m), weight 98.4 kg, SpO2 97 %. Body mass index is 32.04 kg/m?. ? ?Treatment Plan Summary: ?Plan Refer for inpatient treatment. He was started on Haldol in the ED ? ?Vanetta MuldersLouise F Jahquez Steffler, NP ?12/21/2021

## 2021-12-21 NOTE — ED Notes (Signed)
Breakfast and beverage provided 

## 2021-12-21 NOTE — Progress Notes (Signed)
D: Patient alert and oriented this evening. Denies SI/AVH but endorses HI. States, I cant tell you that. Very irritable and edgy during assessment. Rates anxiety 6/10 and depression 4/10. Reports pain. See pain assessment for details.  ? ?Takes medications without difficulty. Remains isolated in room.  ? ?Cont Q15 minute check for safety.  ?

## 2021-12-22 DIAGNOSIS — F1410 Cocaine abuse, uncomplicated: Secondary | ICD-10-CM

## 2021-12-22 DIAGNOSIS — F29 Unspecified psychosis not due to a substance or known physiological condition: Secondary | ICD-10-CM

## 2021-12-22 MED ORDER — ADULT MULTIVITAMIN W/MINERALS CH
1.0000 | ORAL_TABLET | Freq: Every day | ORAL | Status: DC
  Administered 2021-12-23 – 2021-12-25 (×3): 1 via ORAL
  Filled 2021-12-22 (×3): qty 1

## 2021-12-22 NOTE — Plan of Care (Signed)
?  Problem: Education: ?Goal: Knowledge of [facility] Education information/materials will improve ?Outcome: Progressing ?Goal: Mental status will improve ?Outcome: Not Progressing ?  ?Problem: Health Behavior/Discharge Planning: ?Goal: Compliance with treatment plan for underlying cause of condition will improve ?Outcome: Progressing ?  ?Problem: Coping: ?Goal: Ability to use eye contact when communicating with others will improve ?Outcome: Progressing ?  ?Problem: Safety: ?Goal: Ability to remain free from injury will improve ?Outcome: Progressing ?  ?

## 2021-12-22 NOTE — H&P (Signed)
Psychiatric Admission Assessment Adult ? ?Patient Identification: Jeremy [name] ?MRN:  [id number] ?Date of Evaluation:  12/22/2021 ?Chief Complaint:  Substance induced mood disorder (HCC) [F19.94] ?Principal Diagnosis: Psychosis (HCC) ?Diagnosis:  Principal Problem: ?  Psychosis (HCC) ?Active Problems: ?  Substance induced mood disorder (HCC) ?  Cocaine abuse (HCC) ? ?History of Present Illness: Patient seen and chart reviewed.  61 year old man with a history of cocaine abuse came to the emergency room complaining of pain in his feet and also hallucinations.  The pain in his feet came from [place] nonstop [place] [facility] [place].  Patient is describing visual at times but mostly auditory hallucinations and paranoia.  They come and go but have been happening most days.  He says they have been going on for a couple of years but have been worse recently.  He says the voices all sound like black men and he suspects it may be related to his brother.  They say insulting things to him or just comment on what he is doing.  He also feels like he can see people at times were watching him and spying on him and that there are cars that follow him around watching him.  He has some insight that this sounds crazy but at the same time is mostly believing it.  He says that he wishes it would stop and he has thought about at times firing a gun at one of the cars he sees just to find out if it was real.  So far has not done anything violent and does not describe any specific violent intent no suicidal intent.  Patient admits to daily crack cocaine use of about $50 a day.  He says this is significantly less than he used to do which is why he believes that this has nothing to do with drugs.  He denies other drug use except for alcohol and some marijuana.  Not on any medication.  Not seeing anyone for outpatient treatment.  Patient is homeless and lives outdoors or moves from place to place. ?Associated  Signs/Symptoms: ?Depression Symptoms:  insomnia, ?psychomotor agitation, ?anxiety, ?Duration of Depression Symptoms: No data recorded ?(Hypo) Manic Symptoms:  Distractibility, ?Hallucinations, ?Impulsivity, ?Anxiety Symptoms:  Excessive Worry, ?Psychotic Symptoms:  Hallucinations: Auditory ?Paranoia, ?PTSD Symptoms: ?Negative ?Total Time spent with patient: 1 hour ? ?Past Psychiatric History: Patient had at least 1 prior [facility] a couple years ago [facility].  Last year had a stay in their emergency room with treatment but did not actually get admitted.  He tells me that in the distant past he was prescribed lithium and he thinks he remembers being diagnosed with bipolar disorder but cannot remember much in the way of details.  He says he did overdose at 1 point on lithium but he is unsure if there was any suicidal intent.  He has a very long history of cocaine abuse.  No history of engagement really in any protracted treatment or sobriety.  No history of violence to others that he reports. ? ?Is the patient at risk to self? Yes.    ?Has the patient been a risk to self in the past 6 months? Yes.    ?Has the patient been a risk to self within the distant past? Yes.    ?Is the patient a risk to others? Yes.    ?Has the patient been a risk to others in the past 6 months? Yes.    ?Has the patient been a risk to others  within the distant past? Yes.    ? ?Prior Inpatient Therapy:   ?Prior Outpatient Therapy:   ? ?Alcohol Screening: 1. How often do you have a drink containing alcohol?: 4 or more times a week ?2. How many drinks containing alcohol do you have on a typical day when you are drinking?: 3 or 4 ?3. How often do you have six or more drinks on one occasion?: Weekly ?AUDIT-C Score: 8 ?4. How often during the last year have you found that you were not able to stop drinking once you had started?: Never ?5. How often during the last year have you failed to do what was normally expected from you because of  drinking?: Never ?6. How often during the last year have you needed a first drink in the morning to get yourself going after a heavy drinking session?: Never ?7. How often during the last year have you had a feeling of guilt of remorse after drinking?: Never ?8. How often during the last year have you been unable to remember what happened the night before because you had been drinking?: Never ?9. Have you or someone else been injured as a result of your drinking?: No ?10. Has a relative or friend or a doctor or another health worker been concerned about your drinking or suggested you cut down?: No ?Alcohol Use Disorder Identification Test Final Score (AUDIT): 8 ?Alcohol Brief Interventions/Follow-up: Alcohol education/Brief advice ?Substance Abuse History in the last 12 months:  Yes.   ?Consequences of Substance Abuse: ?It is certainly possible that the entirety of his symptoms are a direct result of his chronic cocaine use although it may only be that that is an exacerbating factor ?Previous Psychotropic Medications: Yes  ?Psychological Evaluations: Yes  ?Past Medical History: History reviewed. No pertinent past medical history. History reviewed. No pertinent surgical history. ?Family History: History reviewed. No pertinent family history. ?Family Psychiatric  History: Patient says there have been several members of his family with mood problems and substance abuse problems including an uncle who committed suicide ?Tobacco Screening:   ?Social History:  ?Social History  ? ?Substance and Sexual Activity  ?Alcohol Use Yes  ? Comment: per pt he drinks a pint daily  ?   ?Social History  ? ?Substance and Sexual Activity  ?Drug Use Yes  ? Types: Cocaine  ?  ?Additional Social History: ?  ?   ?  ?  ?  ?  ?  ?  ?  ?  ?  ?  ? ?Allergies:  No Known Allergies ?Lab Results:  ?Results for orders placed or performed during the hospital encounter of 12/20/21 (from the past 48 hour(s))  ?Resp Panel by RT-PCR (Flu A&B, Covid)  Nasopharyngeal Swab     Status: None  ? Collection Time: 12/21/21 11:06 AM  ? Specimen: Nasopharyngeal Swab; Nasopharyngeal(NP) swabs in vial transport medium  ?Result Value Ref Range  ? SARS Coronavirus 2 by RT PCR NEGATIVE NEGATIVE  ?  Comment: (NOTE) ?SARS-CoV-2 target nucleic acids are NOT DETECTED. ? ?The SARS-CoV-2 RNA is generally detectable in upper respiratory ?specimens during the acute phase of infection. The lowest ?concentration of SARS-CoV-2 viral copies this assay can detect is ?138 copies/mL. A negative result does not preclude SARS-Cov-2 ?infection and should not be used as the sole basis for treatment or ?other patient management decisions. A negative result may occur with  ?improper specimen collection/handling, submission of specimen other ?than nasopharyngeal swab, presence of viral mutation(s) within the ?areas targeted by this  assay, and inadequate number of viral ?copies(<138 copies/mL). A negative result must be combined with ?clinical observations, patient history, and epidemiological ?information. The expected result is Negative. ? ?Fact Sheet for Patients:  ?BloggerCourse.com ? ?Fact Sheet for Healthcare Providers:  ?SeriousBroker.it ? ?This test is no t yet approved or cleared by the [place] FDA and  ?has been authorized for detection and/or diagnosis of SARS-CoV-2 by ?FDA under an Emergency Use Authorization (EUA). This EUA will remain  ?in effect (meaning this test can be used) for the duration of the ?COVID-19 declaration under Section 564(b)(1) of the Act, 21 ?U.S.C.section 360bbb-3(b)(1), unless the authorization is terminated  ?or revoked sooner.  ? ? ?  ? Influenza A by PCR NEGATIVE NEGATIVE  ? Influenza B by PCR NEGATIVE NEGATIVE  ?  Comment: (NOTE) ?The Xpert Xpress SARS-CoV-2/FLU/RSV plus assay is intended as an aid ?in the diagnosis of influenza from Nasopharyngeal swab specimens and ?should not be used as a sole basis for  treatment. Nasal washings and ?aspirates are unacceptable for Xpert Xpress SARS-CoV-2/FLU/RSV ?testing. ? ?Fact Sheet for Patients: ?BloggerCourse.com ? ?Fact Sheet for Healthcare Providers: ?https:/

## 2021-12-22 NOTE — Group Note (Unsigned)
[facility] LCSW Group Therapy Note ? ? ?Group Date: 12/22/2021 ?Start Time: 1300 ?End Time: 1400 ? ?Type of Therapy/Topic:  Group Therapy:  Feelings about Diagnosis ? ?Participation Level:  {[facility] PARTICIPATION LEVEL:22264}  ? ?Mood: *** ? ? ?Description of Group:   ? This group will allow patients to explore their thoughts and feelings about diagnoses they have received. Patients will be guided to explore their level of understanding and acceptance of these diagnoses. Facilitator will encourage patients to process their thoughts and feelings about the reactions of others to their diagnosis, and will guide patients in identifying ways to discuss their diagnosis with significant others in their lives. This group will be process-oriented, with patients participating in exploration of their own experiences as well as giving and receiving support and challenge from other group members. ? ? ?Therapeutic Goals: ?1. Patient will demonstrate understanding of diagnosis as evidence by identifying two or more symptoms of the disorder:  ?2. Patient will be able to express two feelings regarding the diagnosis ?3. Patient will demonstrate ability to communicate their needs through discussion and/or role plays ? ?Summary of Patient Progress: ? ? ? ?*** ? ? ? ?Therapeutic Modalities:   ?Cognitive Behavioral Therapy ?Brief Therapy ?Feelings Identification  ? ? ?Davonne Jarnigan W Eliani Leclere, LCSWA ?

## 2021-12-22 NOTE — Progress Notes (Signed)
Nutrition Brief Note ? ?Patient identified on the Malnutrition Screening Tool (MST) Report ? ? 61 y.o. male patient admitted with cocaine abuse with hallucinations. ? ?Wt Readings from Last 15 Encounters:  ?12/21/21 93.4 kg  ?12/20/21 98.4 kg  ?09/21/21 98.4 kg  ? ? ?Body mass index is 30.42 kg/m?Marland Kitchen Patient meets criteria for obesity based on current BMI.  ? ?Current diet order is regular, patient is consuming approximately 100% of meals at this time. Labs and medications reviewed.  ? ?No nutrition interventions warranted at this time. If nutrition issues arise, please consult RD.  ? ?Betsey Holiday MS, RD, LDN ?Please refer to [facility] for RD and/or RD on-call/weekend/after hours pager ? ? ?

## 2021-12-22 NOTE — [facility] Counselor (Signed)
Adult Comprehensive Assessment ? ?Patient ID: Jeremy [name], male   DOB: 05/21/1961, 61 y.o.   MRN: [id number] ? ?Information Source: ?Information source: Patient ? ?Current Stressors:  ?Patient states their primary concerns and needs for treatment are:: States, "someone is following me . . . same two voices , a black mand and a white woman telling me to fuck off." ?Patient states their goals for this hospitilization and ongoing recovery are:: States he would like to, "get some medications and find out if it is true or not (delusions)." ?Educational / Learning stressors: none reported ?Employment / Job issues: patient is unemployed ?Family Relationships: none reported ?Financial / Lack of resources (include bankruptcy): none reported ?Housing / Lack of housing: patient is homeless ?Physical health (include injuries & life threatening diseases): none reported ?Social relationships: states he does not have an friends. ?Substance abuse: patient reports relapsing on alcohol and cocaine since leaving the [facility]. ?Bereavement / Loss: "I have no one to loose" ? ?Living/Environment/Situation:  ?Living Arrangements: Other (Comment) (Patient is homeless) ?Living conditions (as described by patient or guardian): n/a ?Who else lives in the home?: n/a ?How long has patient lived in current situation?: patient has been homeless for 2 weeks, prior to that he lived at the [place] another period f homelessness. ?What is atmosphere in current home: Chaotic, Temporary, Dangerous ? ?Family History:  ?Marital status: Divorced ?Divorced, when?: 2008 ?What types of issues is patient dealing with in the relationship?: none reported ?Does patient have children?: Yes (During this encounter, patient states he does "not really" have any children. Per historical notes patient has 4 children who he has a distant relationship with.) ?How many children?: 4 ?How is patient's relationship with their  children?: Pt reported having a distant relationship with adult children. ? ?Childhood History:  ?By whom was/is the patient raised?: Mother ?Additional childhood history information: Reports his mother was hardly around due to work. ?Description of patient's relationship with caregiver when they were a child: States his relationship with his mother has always been strained. ?Patient's description of current relationship with people who raised him/her: Patient has no contact with mother, when asked if his mother was still alive, patient replied "I do not know." ?How were you disciplined when you got in trouble as a child/adolescent?: physical reprimanded WNL, patient believes punishment was not excessive. ?Does patient have siblings?: Yes ?Number of Siblings: 1 ?Description of patient's current relationship with siblings: repoorts no realtionship with brother. ?Did patient suffer any verbal/emotional/physical/sexual abuse as a child?: No ?Did patient suffer from severe childhood neglect?: No ?Has patient ever been sexually abused/assaulted/raped as an adolescent or adult?: No ?Was the patient ever a victim of a crime or a disaster?: No ?Witnessed domestic violence?: No ?Has patient been affected by domestic violence as an adult?: No ? ?Education:  ?Highest grade of school patient has completed: HS Diploma, Some College ?Currently a student?: No ?Learning disability?: No ? 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 ?Financial Resources:   ?  Financial resources: [employer], No income ?Does patient have a representative payee or guardian?: No ? ?Alcohol/Substance Abuse:   ?What has been your use of drugs/alcohol within the last 12 months?: Reports relapsing on alcohol and cocaine since dicsharging from [employer]. States he spend 200-300 weekly on cocaine. ?Alcohol/Substance Abuse Treatment Hx: Past Tx, Inpatient ?Has alcohol/substance abuse ever caused legal problems?: No ? ?Social Support System:   ?Heritage manager System: None ?Describe Community Support System: denies having any support ? ?Leisure/Recreation:   ?Do You Have Hobbies?: No ? ?Strengths/Needs:   ?Patient states these barriers may affect/interfere with their treatment: none reported ?Patient states these barriers may affect their return to the community: none reported ?Other important information patient would like considered in planning for their treatment: none reported ? ?Discharge Plan:   ?Currently receiving community mental health services: No (Patient interested in outpatient mental health services, [place] w. no listed insurance.) ?Does patient have access to transportation?: No (CSW to assist with transportation at discharge.) ?Does patient have financial barriers related to discharge medications?: Yes (No insurance listed.) ? 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 During assessment, patient expresses irritability and feelings of hopelessness. Endorses stress related to active substance use, unemployment, lack of social supports, and chronic homelessness. 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 Patient  has been chronically homeless and unemployed since 2019. Endorses auditory and visual hallucinations, describing the voices as a black male and white male telling him to "fuck off" in addition to paranoid beliefs that someone is following him. Patient states he has no social supports or people to help him causing him to feel hopeless. Denies childhood or adulthood trauma hx. Presents as irritable albeit cooperative with assessment, oriented to person/place/time/situation. No evidence of memory or concentration impairment. Appearance is WNL. Speech is muffled and slow, likely due to depressive symptoms. Shows fair insight and poor judgment. Therapeutic recommendations include crisis stabilization, medication management, case management, and group therapy. Patient is not currently connected with outpatient mental health services, though he has provided consent for writer to make appropriate referrals. ? ?Durenda Hurt. 12/22/2021 ?

## 2021-12-22 NOTE — [facility] Suicide Risk Assessment (Signed)
[facility] INPATIENT:  Family/Significant Other Suicide Prevention Education ? ?Suicide Prevention Education:  ?Patient Refusal for Family/Significant Other Suicide Prevention Education: The patient Jeremy [name] has refused to provide written consent for family/significant other to be provided Family/Significant Other Suicide Prevention Education during admission and/or prior to discharge.  Physician notified. ? ?CSW completed SPE with patient. Discussed potential triggers leading to suicidal ideation in addition to coping skills one might use in order to delay and distract self from self harming behaviors. CSW encouraged patient to utilize emergency services if they felt unable to maintain their safety. SPE flyer provided to patient at this time.   ? ?Corky Crafts ?12/22/2021, 3:43 PM ?

## 2021-12-22 NOTE — Group Note (Signed)
[facility] LCSW Group Therapy Note ? ? ?Group Date: 12/22/2021 ?Start Time: 1300 ?End Time: 1400 ? ?Type of Therapy/Topic:  Group Therapy:  Feelings about Diagnosis ? ?Participation Level:  Did Not Attend  ? ?Mood: n/a  ? ? ?Description of Group:   ? This group will allow patients to explore their thoughts and feelings about diagnoses they have received. Patients will be guided to explore their level of understanding and acceptance of these diagnoses. Facilitator will encourage patients to process their thoughts and feelings about the reactions of others to their diagnosis, and will guide patients in identifying ways to discuss their diagnosis with significant others in their lives. This group will be process-oriented, with patients participating in exploration of their own experiences as well as giving and receiving support and challenge from other group members. ? ? ?Therapeutic Goals: ?1. Patient will demonstrate understanding of diagnosis as evidence by identifying two or more symptoms of the disorder:  ?2. Patient will be able to express two feelings regarding the diagnosis ?3. Patient will demonstrate ability to communicate their needs through discussion and/or role plays ? ?Summary of Patient Progress: ?Patient did not show up for group despite encouraged participation. CSW called over the intercom 3x prior to starting, mental health tech assisted in informing patients of group.  ? ? ? ?Therapeutic Modalities:   ?Cognitive Behavioral Therapy ?Brief Therapy ?Feelings Identification  ? ? ?Milah Recht J Moneka Mcquinn, LCSW ?

## 2021-12-22 NOTE — Plan of Care (Addendum)
?  Problem: Education: ?Goal: Knowledge of [facility] Education information/materials will improve ?Outcome: Progressing ?Goal: Mental status will improve ?Outcome: Progressing ?Goal: Verbalization of understanding the information provided will improve ?Outcome: Progressing ?  ?Problem: Health Behavior/Discharge Planning: ?Goal: Identification of resources available to assist in meeting health care needs will improve ?Outcome: Progressing ?Goal: Compliance with treatment plan for underlying cause of condition will improve ?Outcome: Progressing ?  ?Problem: Coping: ?Goal: Ability to identify and develop effective coping behavior will improve ?Outcome: Progressing ?Goal: Ability to interact with others will improve ?Outcome: Progressing ?Goal: Demonstration of participation in decision-making regarding own care will improve ?Outcome: Progressing ?Goal: Ability to use eye contact when communicating with others will improve ?Outcome: Progressing ?  ?Problem: Education: ?Goal: Knowledge of disease or condition will improve ?Outcome: Progressing ?Goal: Understanding of discharge needs will improve ?Outcome: Progressing ?  ?Problem: Safety: ?Goal: Ability to remain free from injury will improve ?Outcome: Progressing ?  ?

## 2021-12-22 NOTE — Progress Notes (Signed)
Patient presents irritable with staff. Denies SI/AVH, anxiety and depression. Pt endorses HI but not towards anyone here on the unit. Pt isolative to his room tonight. Pt didn't have any medications scheduled tonight and hasn't requested anything PRN. Pt given education, support, and encouragement to be active in his treatment plan. Pt being monitored Q 15 minutes for safety per unit protocol. Pt remains safe on the unit.  ?
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
## 2021-12-22 NOTE — Progress Notes (Addendum)
D: Patient alert and oriented. Patient rates pain 4/10. Patient refused PRN medication for pain. Patient denies anxiety and depression. Patient denies SI/AVH. Patient endorses HI but states he does not want to hurt anyone that is on the unit. Patient isolative to room during shift with the exception to coming out for medication and meals.  ? ?A: Scheduled medications administered to patient, per MD orders.  Support and encouragement provided to patient.  ?Q15 minute safety checks maintained.  ? ?R: Patient compliant with medication administration and treatment plan. No adverse drug reactions noted. Patient remains safe on the unit at this time.  ?

## 2021-12-22 NOTE — Progress Notes (Signed)
Recreation Therapy Notes ? ?Date: 12/22/2021 ? ?Time: 10:20 am   ? ?Location: Craft room   ? ?Behavioral response: Isolated,Not engaged.  ? ?Intervention Topic: Time Management   ? ?Discussion/Intervention:  ?Group content today was focused on time management. The group defined time management and identified healthy ways to manage time. Individuals expressed how much of the 24 hours they use in a day. Patients expressed how much time they use just for themselves personally. The group expressed how they have managed their time in the past. Individuals participated in the intervention ?Managing Life? where they had a chance to see how much of the 24 hours they use and where it goes. ?Clinical Observations/Feedback: ?Patient came to group and sat in the corner isolated and unengaged in the group discussion and intervention.  ? ? ? ?Alizae Bechtel LRT/CTRS  ? ? ? ? ? ? ? ?Braelyn Bordonaro ?12/22/2021 11:54 AM ?

## 2021-12-23 DIAGNOSIS — F29 Unspecified psychosis not due to a substance or known physiological condition: Secondary | ICD-10-CM | POA: Diagnosis not present

## 2021-12-23 LAB — LIPID PANEL
Cholesterol: 179 mg/dL (ref 0–200)
HDL: 49 mg/dL (ref >40)
LDL Cholesterol: 94 mg/dL (ref 0–99)
Total CHOL/HDL Ratio: 3.7 RATIO
Triglycerides: 178 mg/dL — ABNORMAL HIGH (ref <150)
VLDL: 36 mg/dL (ref 0–40)

## 2021-12-23 NOTE — Progress Notes (Signed)
[facility] MD Progress Note ? ?12/23/2021 10:49 AM ?Jeremy [name]  ?MRN:  [id number] ?Subjective: Follow-up for this 61 year old man with substance abuse and psychosis.  Patient says he slept okay last night.  He attended treatment team today.  He says he is not having hallucinations so far this morning.  Paranoia is slightly less intense.  Patient is able to focus on substance abuse treatment plans.  He is wanting to go to an [place] house although it is not clear if this is a reasonable option at this point.  No other new physical complaints. ?Principal Problem: Psychosis (HCC) ?Diagnosis: Principal Problem: ?  Psychosis (HCC) ?Active Problems: ?  Substance induced mood disorder (HCC) ?  Cocaine abuse (HCC) ? ?Total Time spent with patient: 30 minutes ? ?Past Psychiatric History: Past history of longstanding substance abuse problems with psychosis more recently possibly related to drug use ? ?Past Medical History: History reviewed. No pertinent past medical history. History reviewed. No pertinent surgical history. ?Family History: History reviewed. No pertinent family history. ?Family Psychiatric  History: See previous ?Social History:  ?Social History  ? ?Substance and Sexual Activity  ?Alcohol Use Yes  ? Comment: per pt he drinks a pint daily  ?   ?Social History  ? ?Substance and Sexual Activity  ?Drug Use Yes  ? Types: Cocaine  ?  ?Social History  ? ?Socioeconomic History  ? Marital status: Single  ?  Spouse name: Not on file  ? Number of children: Not on file  ? Years of education: Not on file  ? Highest education level: Not on file  ?Occupational History  ? Not on file  ?Tobacco Use  ? Smoking status: Never  ? Smokeless tobacco: Never  ?Vaping Use  ? Vaping Use: Never used  ?Substance and Sexual Activity  ? Alcohol use: Yes  ?  Comment: per pt he drinks a pint daily  ? Drug use: Yes  ?  Types: Cocaine  ? Sexual activity: Not Currently  ?Other Topics Concern  ? Not on file  ?Social History Narrative  ? Not on file   ? ?Social Determinants of Health  ? ?Financial Resource Strain: Not on file  ?Food Insecurity: Not on file  ?Transportation Needs: Not on file  ?Physical Activity: Not on file  ?Stress: Not on file  ?Social Connections: Not on file  ? ?Additional Social History:  ?  ?  ?  ?  ?  ?  ?  ?  ?  ?  ?  ? ?Sleep: Fair ? ?Appetite:  Fair ? ?Current Medications: ?Current Facility-Administered Medications  ?Medication Dose Route Frequency Provider Last Rate Last Admin  ? acetaminophen (TYLENOL) tablet 650 mg  650 mg Oral Q6H PRN Vanetta Mulders, NP   650 mg at 12/21/21 2110  ? alum & mag hydroxide-simeth (MAALOX/MYLANTA) 200-200-20 MG/5ML suspension 30 mL  30 mL Oral Q4H PRN Gabriel Cirri F, NP      ? benztropine (COGENTIN) tablet 0.5 mg  0.5 mg Oral Daily Gabriel Cirri F, NP   0.5 mg at 12/23/21 1694  ? haloperidol (HALDOL) tablet 2 mg  2 mg Oral BID Gabriel Cirri F, NP   2 mg at 12/23/21 5038  ? magnesium hydroxide (MILK OF MAGNESIA) suspension 30 mL  30 mL Oral Daily PRN Vanetta Mulders, NP      ? multivitamin with minerals tablet 1 tablet  1 tablet Oral Daily Vibhav Waddill, Jackquline Denmark, MD   1 tablet at 12/23/21 8828  ? ? ?  Lab Results:  ?Results for orders placed or performed during the hospital encounter of 12/20/21 (from the past 48 hour(s))  ?Resp Panel by RT-PCR (Flu A&B, Covid) Nasopharyngeal Swab     Status: None  ? Collection Time: 12/21/21 11:06 AM  ? Specimen: Nasopharyngeal Swab; Nasopharyngeal(NP) swabs in vial transport medium  ?Result Value Ref Range  ? SARS Coronavirus 2 by RT PCR NEGATIVE NEGATIVE  ?  Comment: (NOTE) ?SARS-CoV-2 target nucleic acids are NOT DETECTED. ? ?The SARS-CoV-2 RNA is generally detectable in upper respiratory ?specimens during the acute phase of infection. The lowest ?concentration of SARS-CoV-2 viral copies this assay can detect is ?138 copies/mL. A negative result does not preclude SARS-Cov-2 ?infection and should not be used as the sole basis for treatment or ?other patient  management decisions. A negative result may occur with  ?improper specimen collection/handling, submission of specimen other ?than nasopharyngeal swab, presence of viral mutation(s) within the ?areas targeted by this assay, and inadequate number of viral ?copies(<138 copies/mL). A negative result must be combined with ?clinical observations, patient history, and epidemiological ?information. The expected result is Negative. ? ?Fact Sheet for Patients:  ?BloggerCourse.comhttps://www.fda.gov/media/152166/download ? ?Fact Sheet for Healthcare Providers:  ?SeriousBroker.ithttps://www.fda.gov/media/152162/download ? ?This test is no t yet approved or cleared by the [place] FDA and  ?has been authorized for detection and/or diagnosis of SARS-CoV-2 by ?FDA under an Emergency Use Authorization (EUA). This EUA will remain  ?in effect (meaning this test can be used) for the duration of the ?COVID-19 declaration under Section 564(b)(1) of the Act, 21 ?U.S.C.section 360bbb-3(b)(1), unless the authorization is terminated  ?or revoked sooner.  ? ? ?  ? Influenza A by PCR NEGATIVE NEGATIVE  ? Influenza B by PCR NEGATIVE NEGATIVE  ?  Comment: (NOTE) ?The Xpert Xpress SARS-CoV-2/FLU/RSV plus assay is intended as an aid ?in the diagnosis of influenza from Nasopharyngeal swab specimens and ?should not be used as a sole basis for treatment. Nasal washings and ?aspirates are unacceptable for Xpert Xpress SARS-CoV-2/FLU/RSV ?testing. ? ?Fact Sheet for Patients: ?BloggerCourse.comhttps://www.fda.gov/media/152166/download ? ?Fact Sheet for Healthcare Providers: ?SeriousBroker.ithttps://www.fda.gov/media/152162/download ? ?This test is not yet approved or cleared by the [place] FDA and ?has been authorized for detection and/or diagnosis of SARS-CoV-2 by ?FDA under an Emergency Use Authorization (EUA). This EUA will remain ?in effect (meaning this test can be used) for the duration of the ?COVID-19 declaration under Section 564(b)(1) of the Act, 21 U.S.C. ?section 360bbb-3(b)(1), unless the  authorization is terminated or ?revoked. ? ?Performed at [facility], [address] [place], ?[place] [id number] ?  ? ? ?Blood Alcohol level:  ?Lab Results  ?Component Value Date  ? ETH <10 12/20/2021  ? ETH <10 09/21/2021  ? ? ?Metabolic Disorder Labs: ?No results found for: HGBA1C, MPG ?No results found for: PROLACTIN ?No results found for: CHOL, TRIG, HDL, CHOLHDL, VLDL, LDLCALC ? ?Physical Findings: ?AIMS:  , ,  ,  ,    ?CIWA:    ?COWS:    ? ?Musculoskeletal: ?Strength & Muscle Tone: within normal limits ?Gait & Station: normal ?Patient leans: N/A ? ?Psychiatric Specialty Exam: ? ?Presentation  ?General Appearance: Casual ? ?Eye Contact:Fair ? ?Speech:Clear and Coherent ? ?Speech Volume:Normal ? ?Handedness:No data recorded ? ?Mood and Affect  ?Mood:Dysphoric ? ?Affect:Congruent ? ? ?Thought Process  ?Thought Processes:Coherent ? ?Descriptions of Associations:Intact ? ?Orientation:Full (Time, Place and Person) ? ?Thought Content:Logical ? ?History of Schizophrenia/Schizoaffective disorder:No ? ?Duration of Psychotic Symptoms:N/A ? ?Hallucinations:No data recorded ?Ideas of Reference:Paranoia ("I see  people following me in cars") ? ?Suicidal Thoughts:No data recorded ?Homicidal Thoughts:No data recorded ? ?Sensorium  ?Memory:Immediate Fair ? ?Judgment:Poor ? ?Insight:No data recorded ? ?Executive Functions  ?Concentration:Fair ? ?Attention Span:Fair ? ?Recall:Fair ? ?Fund of Knowledge:Fair ? ?Language:Fair ? ? ?Psychomotor Activity  ?Psychomotor Activity:No data recorded ? ?Assets  ?Assets:Resilience ? ? ?Sleep  ?Sleep:No data recorded ? ? ?Physical Exam: ?Physical Exam ?Vitals and nursing note reviewed.  ?Constitutional:   ?   Appearance: Normal appearance.  ?HENT:  ?   Head: Normocephalic and atraumatic.  ?   Mouth/Throat:  ?   Pharynx: Oropharynx is clear.  ?Eyes:  ?   Pupils: Pupils are equal, round, and reactive to light.  ?Cardiovascular:  ?   Rate and Rhythm: Normal rate and regular rhythm.   ?Pulmonary:  ?   Effort: Pulmonary effort is normal.  ?   Breath sounds: Normal breath sounds.  ?Abdominal:  ?   General: Abdomen is flat.  ?   Palpations: Abdomen is soft.  ?Musculoskeletal:     ?   General:

## 2021-12-23 NOTE — Group Note (Signed)
[facility] LCSW Group Therapy Note ? ? ?Group Date: 12/23/2021 ?Start Time: 1300 ?End Time: 1400 ? ? ?Type of Therapy/Topic:  Group Therapy:  Emotion Regulation ? ?Participation Level:  Did Not Attend  ? ?Mood: ? ?Description of Group:   ? The purpose of this group is to assist patients in learning to regulate negative emotions and experience positive emotions. Patients will be guided to discuss ways in which they have been vulnerable to their negative emotions. These vulnerabilities will be juxtaposed with experiences of positive emotions or situations, and patients challenged to use positive emotions to combat negative ones. Special emphasis will be placed on coping with negative emotions in conflict situations, and patients will process healthy conflict resolution skills. ? ?Therapeutic Goals: ?Patient will identify two positive emotions or experiences to reflect on in order to balance out negative emotions:  ?Patient will label two or more emotions that they find the most difficult to experience:  ?Patient will be able to demonstrate positive conflict resolution skills through discussion or role plays:  ? ?Summary of Patient Progress: ? ? ?x ? ? ? ?Therapeutic Modalities:   ?Cognitive Behavioral Therapy ?Feelings Identification ?Dialectical Behavioral Therapy ? ? ?Rosezella [place], Student-Social Work ?

## 2021-12-23 NOTE — Progress Notes (Signed)
Patient presents irritable with staff. Denies SI/AVH, anxiety and depression. Pt endorses HI but not towards anyone here on the unit. Pt isolative to his room tonight. Pt didn't have any medications scheduled tonight and hasn't requested anything PRN. Pt given education, support, and encouragement to be active in his treatment plan. Pt being monitored Q 15 minutes for safety per unit protocol. Pt remains safe on the unit.  ?

## 2021-12-23 NOTE — Progress Notes (Signed)
Patient presents irritable with staff. Denies SI/AVH, anxiety and depression. Pt endorses HI but not towards anyone here on the unit. Pt isolative to his room tonight. Pt didn't have any medications scheduled tonight and hasn't requested anything PRN. Pt given education, support, and encouragement to be active in his treatment plan. Pt being monitored Q 15 minutes for safety per unit protocol. Pt remains safe on the unit.  ?

## 2021-12-23 NOTE — Plan of Care (Signed)
Patient out in the milieu for sometime today. Patient rated his anxiety and depression 8/10. Stated that the voices getting better. Denies SI,HI and VH. No irritable behaviors noted. Did not attend groups. Compliant with medications. Appetite and energy level good. ADLs maintained. Support and encouragement given. ?

## 2021-12-23 NOTE — BH IP Treatment Plan (Signed)
Interdisciplinary Treatment and Diagnostic Plan Update ? ?12/23/2021 ?Time of Session: 1000 ?Jeremy [name] ?MRN: [id number] ? ?Principal Diagnosis: Psychosis (HCC) ? ?Secondary Diagnoses: Principal Problem: ?  Psychosis (HCC) ?Active Problems: ?  Substance induced mood disorder (HCC) ?  Cocaine abuse (HCC) ? ? ?Current Medications:  ?Current Facility-Administered Medications  ?Medication Dose Route Frequency Provider Last Rate Last Admin  ? acetaminophen (TYLENOL) tablet 650 mg  650 mg Oral Q6H PRN Vanetta Mulders, NP   650 mg at 12/21/21 2110  ? alum & mag hydroxide-simeth (MAALOX/MYLANTA) 200-200-20 MG/5ML suspension 30 mL  30 mL Oral Q4H PRN Gabriel Cirri F, NP      ? benztropine (COGENTIN) tablet 0.5 mg  0.5 mg Oral Daily Gabriel Cirri F, NP   0.5 mg at 12/23/21 0175  ? haloperidol (HALDOL) tablet 2 mg  2 mg Oral BID Gabriel Cirri F, NP   2 mg at 12/23/21 1025  ? magnesium hydroxide (MILK OF MAGNESIA) suspension 30 mL  30 mL Oral Daily PRN Vanetta Mulders, NP      ? multivitamin with minerals tablet 1 tablet  1 tablet Oral Daily Clapacs, Jackquline Denmark, MD   1 tablet at 12/23/21 8527  ? ?PTA Medications: ?No medications prior to admission.  ? ? ?Patient Stressors: Medication change or noncompliance   ?Substance abuse   ? ?Patient Strengths: Average or above average intelligence  ?Communication skills  ?Physical Health  ? ?Treatment Modalities: Medication Management, Group therapy, Case management,  ?1 to 1 session with clinician, Psychoeducation, Recreational therapy. ? ? ?Physician Treatment Plan for Primary Diagnosis: Psychosis (HCC) ?Long Term Goal(s): Improvement in symptoms so as ready for discharge  ? ?Short Term Goals: Compliance with prescribed medications will improve ?Ability to verbalize feelings will improve ?Ability to disclose and discuss suicidal ideas ?Ability to demonstrate self-control will improve ? ?Medication Management: Evaluate patient's response, side effects, and tolerance of  medication regimen. ? ?Therapeutic Interventions: 1 to 1 sessions, Unit Group sessions and Medication administration. ? ?Evaluation of Outcomes: Progressing ? ?Physician Treatment Plan for Secondary Diagnosis: Principal Problem: ?  Psychosis (HCC) ?Active Problems: ?  Substance induced mood disorder (HCC) ?  Cocaine abuse (HCC) ? ?Long Term Goal(s): Improvement in symptoms so as ready for discharge  ? ?Short Term Goals: Compliance with prescribed medications will improve ?Ability to verbalize feelings will improve ?Ability to disclose and discuss suicidal ideas ?Ability to demonstrate self-control will improve    ? ?Medication Management: Evaluate patient's response, side effects, and tolerance of medication regimen. ? ?Therapeutic Interventions: 1 to 1 sessions, Unit Group sessions and Medication administration. ? ?Evaluation of Outcomes: Progressing ? ? ?RN Treatment Plan for Primary Diagnosis: Psychosis (HCC) ?Long Term Goal(s): Knowledge of disease and therapeutic regimen to maintain health will improve ? ?Short Term Goals: Ability to remain free from injury will improve, Ability to verbalize frustration and anger appropriately will improve, Ability to demonstrate self-control, Ability to participate in decision making will improve, Ability to verbalize feelings will improve, Ability to disclose and discuss suicidal ideas, Ability to identify and develop effective coping behaviors will improve, and Compliance with prescribed medications will improve ? ?Medication Management: RN will administer medications as ordered by provider, will assess and evaluate patient's response and provide education to patient for prescribed medication. RN will report any adverse and/or side effects to prescribing provider. ? ?Therapeutic Interventions: 1 on 1 counseling sessions, Psychoeducation, Medication administration, Evaluate responses to treatment, Monitor vital signs and CBGs as ordered, Perform/monitor CIWA, COWS, AIMS  and  Fall Risk screenings as ordered, Perform wound care treatments as ordered. ? ?Evaluation of Outcomes: Progressing ? ? ?LCSW Treatment Plan for Primary Diagnosis: Psychosis (HCC) ?Long Term Goal(s): Safe transition to appropriate next level of care at discharge, Engage patient in therapeutic group addressing interpersonal concerns. ? ?Short Term Goals: Engage patient in aftercare planning with referrals and resources, Increase social support, Increase ability to appropriately verbalize feelings, Increase emotional regulation, Facilitate acceptance of mental health diagnosis and concerns, Facilitate patient progression through stages of change regarding substance use diagnoses and concerns, Identify triggers associated with mental health/substance abuse issues, and Increase skills for wellness and recovery ? ?Therapeutic Interventions: Assess for all discharge needs, 1 to 1 time with Child psychotherapist, Explore available resources and support systems, Assess for adequacy in community support network, Educate family and significant other(s) on suicide prevention, Complete Psychosocial Assessment, Interpersonal group therapy. ? ?Evaluation of Outcomes: Progressing ? ? ?Progress in Treatment: ?Attending groups: No. ?Participating in groups: No. ?Taking medication as prescribed: Yes. ?Toleration medication: Yes. ?Family/Significant other contact made: No, will contact:  Patient declined consent for CSW to reach family/friend.  ?Patient understands diagnosis: Yes. ?Discussing patient identified problems/goals with staff: Yes. ?Medical problems stabilized or resolved: Yes. ?Denies suicidal/homicidal ideation: Yes. ?Issues/concerns per patient self-inventory: Yes. ?Other: none  ? ?New problem(s) identified: No, Describe:  No additional problems/concerns at this time. Patient reports psychotic symptoms (auditory hallucinations) have been improving. Continue treatment plan for further stabilization. Patient interested in living at  an [facility] while he pursues employment.   ? ?New Short Term/Long Term Goal(s): Patient to work towards detox, elimination of symptoms of psychosis, medication management for mood stabilization; elimination of SI thoughts; development of comprehensive mental wellness/sobriety plan. ? ?Patient Goals:  States, "need to try and get situated, suppose to start a job or get placement somewhere else." ? ?Discharge Plan or Barriers: Patient is homeless currently, CSW to explore housing resource with patient.  ? ?Reason for Continuation of Hospitalization: Depression ?Hallucinations ?Other; describe Active cocaine and alcohol use.  ? ?Estimated Length of Stay: 1-7 days  ? ? ?Scribe for Treatment Team: ?Almedia Balls ?12/23/2021 ?10:34 AM ?

## 2021-12-24 DIAGNOSIS — F29 Unspecified psychosis not due to a substance or known physiological condition: Secondary | ICD-10-CM | POA: Diagnosis not present

## 2021-12-24 LAB — HEMOGLOBIN A1C
Hgb A1c MFr Bld: 5.6 % (ref 4.8–5.6)
Mean Plasma Glucose: 114 mg/dL

## 2021-12-24 NOTE — Progress Notes (Signed)
Recreation Therapy Notes ? ?INPATIENT RECREATION THERAPY ASSESSMENT ? ?Patient Details ?Name: Jeremy [name] ?MRN: [id number] ?DOB: 1961/09/05 ?Today's Date: 12/24/2021 ?      ?Information Obtained From: ?Patient ? ?Able to Participate in Assessment/Interview: ?Yes ? ?Patient Presentation: ?Responsive ? ?Reason for Admission (Per Patient): ?Active Symptoms ? ?Patient Stressors: ?  ? ?Coping Skills:   ?Other (Comment) (None) ? ?Leisure Interests (2+):  ? (None) ? ?Frequency of Recreation/Participation: ?Monthly ? ?Awareness of Community Resources:  ?No ? ?Community Resources:  ?  ? ?Current Use: ?  ? ?If no, Barriers?: ?  ? ?Expressed Interest in [employer] Information: ?Yes ? ?[place] of Residence:  ?[place] ? ?Patient Main Form of Transportation: ?Therapist, music ? ?Patient Strengths:  ?N/A ? ?Patient Identified Areas of Improvement:  ?N/A ? ?Patient Goal for Hospitalization:  ?Get medication ? ?Current SI (including self-harm):  ?No ? ?Current HI:  ?No ? ?Current AVH: ?No ? ?Staff Intervention Plan: ?Group Attendance, Collaborate with Interdisciplinary Treatment Team ? ?Consent to Intern Participation: ?N/A ? ?Jeremy [name] ?12/24/2021, 2:46 PM ?

## 2021-12-24 NOTE — Progress Notes (Signed)
Patient presents irritable with staff. Denies SI/HI/AVH, anxiety and depression. Pt didn't have any medications scheduled tonight and hasn't requested anything PRN. Pt given education, support, and encouragement to be active in his treatment plan. Pt being monitored Q 15 minutes for safety per unit protocol. Pt remains safe on the unit.  ?

## 2021-12-24 NOTE — Plan of Care (Signed)
?  Problem: Education: ?Goal: Knowledge of [facility] Education information/materials will improve ?Outcome: Progressing ?Goal: Mental status will improve ?Outcome: Progressing ?Goal: Verbalization of understanding the information provided will improve ?Outcome: Progressing ?  ?Problem: Health Behavior/Discharge Planning: ?Goal: Identification of resources available to assist in meeting health care needs will improve ?Outcome: Progressing ?Goal: Compliance with treatment plan for underlying cause of condition will improve ?Outcome: Progressing ?  ?Problem: Coping: ?Goal: Ability to identify and develop effective coping behavior will improve ?Outcome: Progressing ?Goal: Ability to interact with others will improve ?Outcome: Progressing ?Goal: Demonstration of participation in decision-making regarding own care will improve ?Outcome: Progressing ?Goal: Ability to use eye contact when communicating with others will improve ?Outcome: Progressing ?  ?Problem: Education: ?Goal: Knowledge of disease or condition will improve ?Outcome: Progressing ?Goal: Understanding of discharge needs will improve ?Outcome: Progressing ?  ?Problem: Safety: ?Goal: Ability to remain free from injury will improve ?Outcome: Progressing ?  ?

## 2021-12-24 NOTE — Progress Notes (Signed)
Recreation Therapy Notes ? ?Date: 12/24/2021 ? ?Time: 9:15 am  ? ?Location: Craft room   ? ?Behavioral response: Appropriate  ? ?Intervention Topic: Animal Assisted Therapy  ? ?Discussion/Intervention:  ?Animal Assisted Therapy took place today during group.  Animal Assisted Therapy is the planned inclusion of an animal in a patient's treatment plan. The patients were able to engage in therapy with an animal during group. Participants were educated on what a service dog is and the different between a support dog and a service dog. Patient were informed on how animal needs are like a person needs. Individuals were enlightened on the process to get a service animal or support animal. Patients got the opportunity to pet the animal and were offered emotional support from the animal and staff.  ?Clinical Observations/Feedback:  ?Patient came to group and was on topic and was focused on what peers and staff had to say. Participant shared their experiences and history with animals. Individual was social with peers, staff and animal while participating in group.  ?Nichole Neyer LRT/CTRS  ? ? ? ? ? ? ? ?Shilo Philipson ?12/24/2021 12:18 PM ?

## 2021-12-24 NOTE — Progress Notes (Signed)
[facility] MD Progress Note ? ?12/24/2021 2:14 PM ?Jeremy [name]  ?MRN:  [id number] ?Subjective: Follow-up with this 61 year old man with substance abuse who is reporting hallucinations and paranoia when he came in.  Patient tells me he is feeling much better today.  Not having any hallucinations.  Not having any sense of paranoia.  He tells me that he even trying to imagine believing all the things he did before he came in that he could not do it.  Patient is smiling and upbeat.  Focused on outpatient treatment and trying to find a place to stay.  Tolerating medicine fine. ?Principal Problem: Psychosis (HCC) ?Diagnosis: Principal Problem: ?  Psychosis (HCC) ?Active Problems: ?  Substance induced mood disorder (HCC) ?  Cocaine abuse (HCC) ? ?Total Time spent with patient: 30 minutes ? ?Past Psychiatric History: Past history of chronic substance abuse especially cocaine use ? ?Past Medical History: History reviewed. No pertinent past medical history. History reviewed. No pertinent surgical history. ?Family History: History reviewed. No pertinent family history. ?Family Psychiatric  History: See previous ?Social History:  ?Social History  ? ?Substance and Sexual Activity  ?Alcohol Use Yes  ? Comment: per pt he drinks a pint daily  ?   ?Social History  ? ?Substance and Sexual Activity  ?Drug Use Yes  ? Types: Cocaine  ?  ?Social History  ? ?Socioeconomic History  ? Marital status: Single  ?  Spouse name: Not on file  ? Number of children: Not on file  ? Years of education: Not on file  ? Highest education level: Not on file  ?Occupational History  ? Not on file  ?Tobacco Use  ? Smoking status: Never  ? Smokeless tobacco: Never  ?Vaping Use  ? Vaping Use: Never used  ?Substance and Sexual Activity  ? Alcohol use: Yes  ?  Comment: per pt he drinks a pint daily  ? Drug use: Yes  ?  Types: Cocaine  ? Sexual activity: Not Currently  ?Other Topics Concern  ? Not on file  ?Social History Narrative  ? Not on file  ? ?Social  Determinants of Health  ? ?Financial Resource Strain: Not on file  ?Food Insecurity: Not on file  ?Transportation Needs: Not on file  ?Physical Activity: Not on file  ?Stress: Not on file  ?Social Connections: Not on file  ? ?Additional Social History:  ?  ?  ?  ?  ?  ?  ?  ?  ?  ?  ?  ? ?Sleep: Fair ? ?Appetite:  Fair ? ?Current Medications: ?Current Facility-Administered Medications  ?Medication Dose Route Frequency Provider Last Rate Last Admin  ? acetaminophen (TYLENOL) tablet 650 mg  650 mg Oral Q6H PRN Vanetta Mulders, NP   650 mg at 12/21/21 2110  ? alum & mag hydroxide-simeth (MAALOX/MYLANTA) 200-200-20 MG/5ML suspension 30 mL  30 mL Oral Q4H PRN Gabriel Cirri F, NP      ? benztropine (COGENTIN) tablet 0.5 mg  0.5 mg Oral Daily Gabriel Cirri F, NP   0.5 mg at 12/24/21 2979  ? haloperidol (HALDOL) tablet 2 mg  2 mg Oral BID Gabriel Cirri F, NP   2 mg at 12/24/21 8921  ? magnesium hydroxide (MILK OF MAGNESIA) suspension 30 mL  30 mL Oral Daily PRN Vanetta Mulders, NP      ? multivitamin with minerals tablet 1 tablet  1 tablet Oral Daily Elisha Mcgruder, Jackquline Denmark, MD   1 tablet at 12/24/21 0919  ? ? ?  Lab Results:  ?Results for orders placed or performed during the hospital encounter of 12/21/21 (from the past 48 hour(s))  ?Hemoglobin A1c     Status: None  ? Collection Time: 12/23/21  1:04 PM  ?Result Value Ref Range  ? Hgb A1c MFr Bld 5.6 4.8 - 5.6 %  ?  Comment: (NOTE) ?        Prediabetes: 5.7 - 6.4 ?        Diabetes: >6.4 ?        Glycemic control for adults with diabetes: <7.0 ?  ? Mean Plasma Glucose 114 mg/dL  ?  Comment: (NOTE) ?Performed At: [facility] ?[address] [place], [place] [id number] ?Jolene SchimkeNagendra Sanjai MD WU:[id number]:[phone] ?  ?Lipid panel     Status: Abnormal  ? Collection Time: 12/23/21  1:04 PM  ?Result Value Ref Range  ? Cholesterol 179 0 - 200 mg/dL  ? Triglycerides 178 (H) <150 mg/dL  ? HDL 49 >40 mg/dL  ? Total CHOL/HDL Ratio 3.7 RATIO  ? VLDL 36 0 - 40 mg/dL  ? LDL Cholesterol  94 0 - 99 mg/dL  ?  Comment:        ?Total Cholesterol/HDL:CHD Risk ?Coronary Heart Disease Risk Table ?                    Men   Women ? 1/2 Average Risk   3.4   3.3 ? Average Risk       5.0   4.4 ? 2 X Average Risk   9.6   7.1 ? 3 X Average Risk  23.4   11.0 ?       ?Use the calculated Patient Ratio ?above and the CHD Risk Table ?to determine the patient's CHD Risk. ?       ?ATP III CLASSIFICATION (LDL): ? <100     mg/dL   Optimal ? 956-213100-129  mg/dL   Near or Above ?                   Optimal ? 130-159  mg/dL   Borderline ? 160-189  mg/dL   High ? >086>190     mg/dL   Very High ?Performed at  [facility], [address] [place], [place] [address] ?  ? ? ?Blood Alcohol level:  ?Lab Results  ?Component Value Date  ? ETH <10 12/20/2021  ? ETH <10 09/21/2021  ? ? ?Metabolic Disorder Labs: ?Lab Results  ?Component Value Date  ? HGBA1C 5.6 12/23/2021  ? MPG 114 12/23/2021  ? ?No results found for: PROLACTIN ?Lab Results  ?Component Value Date  ? CHOL 179 12/23/2021  ? TRIG 178 (H) 12/23/2021  ? HDL 49 12/23/2021  ? CHOLHDL 3.7 12/23/2021  ? VLDL 36 12/23/2021  ? LDLCALC 94 12/23/2021  ? ? ?Physical Findings: ?AIMS:  , ,  ,  ,    ?CIWA:    ?COWS:    ? ?Musculoskeletal: ?Strength & Muscle Tone: within normal limits ?Gait & Station: normal ?Patient leans: N/A ? ?Psychiatric Specialty Exam: ? ?Presentation  ?General Appearance: Casual ? ?Eye Contact:Fair ? ?Speech:Clear and Coherent ? ?Speech Volume:Normal ? ?Handedness:No data recorded ? ?Mood and Affect  ?Mood:Dysphoric ? ?Affect:Congruent ? ? ?Thought Process  ?Thought Processes:Coherent ? ?Descriptions of Associations:Intact ? ?Orientation:Full (Time, Place and Person) ? ?Thought Content:Logical ? ?History of Schizophrenia/Schizoaffective disorder:No ? ?Duration of Psychotic Symptoms:N/A ? ?Hallucinations:No data recorded ?Ideas of Reference:Paranoia ("I see people following me in cars") ? ?Suicidal Thoughts:No data  recorded ?Homicidal Thoughts:No data  recorded ? ?Sensorium  ?Memory:Immediate Fair ? ?Judgment:Poor ? ?Insight:No data recorded ? ?Executive Functions  ?Concentration:Fair ? ?Attention Span:Fair ? ?Recall:Fair ? ?Fund of Knowledge:Fair ? ?Language:Fair ? ? ?Psychomotor Activity  ?Psychomotor Activity:No data recorded ? ?Assets  ?Assets:Resilience ? ? ?Sleep  ?Sleep:No data recorded ? ? ?Physical Exam: ?Physical Exam ?Vitals and nursing note reviewed.  ?Constitutional:   ?   Appearance: Normal appearance.  ?HENT:  ?   Head: Normocephalic and atraumatic.  ?   Mouth/Throat:  ?   Pharynx: Oropharynx is clear.  ?Eyes:  ?   Pupils: Pupils are equal, round, and reactive to light.  ?Cardiovascular:  ?   Rate and Rhythm: Normal rate and regular rhythm.  ?Pulmonary:  ?   Effort: Pulmonary effort is normal.  ?   Breath sounds: Normal breath sounds.  ?Abdominal:  ?   General: Abdomen is flat.  ?   Palpations: Abdomen is soft.  ?Musculoskeletal:     ?   General: Normal range of motion.  ?Skin: ?   General: Skin is warm and dry.  ?Neurological:  ?   General: No focal deficit present.  ?   Mental Status: He is alert. Mental status is at baseline.  ?Psychiatric:     ?   Attention and Perception: Attention normal. He does not perceive auditory hallucinations.     ?   Mood and Affect: Mood normal.     ?   Speech: Speech normal.     ?   Behavior: Behavior is cooperative.     ?   Thought Content: Thought content normal.     ?   Cognition and Memory: Cognition normal.  ? ?Review of Systems  ?Constitutional: Negative.   ?HENT: Negative.    ?Eyes: Negative.   ?Respiratory: Negative.    ?Cardiovascular: Negative.   ?Gastrointestinal: Negative.   ?Musculoskeletal: Negative.   ?Skin: Negative.   ?Neurological: Negative.   ?Psychiatric/Behavioral: Negative.    ?Blood pressure 99/67, pulse 67, temperature 98.4 ?F (36.9 ?C), temperature source Oral, resp. rate 17, height 5\' 9"  (1.753 m), weight 93.4 kg, SpO2 99 %. Body mass index is 30.42 kg/m?. ? ? ?Treatment Plan  Summary: ?Medication management and Plan no change to medication management.  Supportive therapy and psychoeducation completed.  Urged patient to be calling [facility] and talking with social work ? ? , MD ?12/24/2021, 2:1

## 2021-12-24 NOTE — Progress Notes (Signed)
D: Pt alert and oriented. Pt rates depression 0/10, hopelessness 3/10, and anxiety 5/10.  Pt reports energy level as good and concentration as being good. Pt reports sleep last night as being good. Pt denies experiencing any pain at this time. Pt denies experiencing any SI/HI, or AVH at this time.  ? ?A: Scheduled medications administered to pt, per MD orders. Support and encouragement provided. Frequent verbal contact made. Routine safety checks conducted q15 minutes.  ? ?R: No adverse drug reactions noted. Pt verbally contracts for safety at this time. Pt complaint with medications and treatment plan. Pt isolates to self except for meals.  Pt remains safe at this time. Will continue to monitor.    ?

## 2021-12-24 NOTE — Progress Notes (Signed)
Recreation Therapy Notes ? ?INPATIENT RECREATION TR PLAN ? ?Patient Details ?Name: Jeremy [name] ?MRN: [id number] ?DOB: October 16, 1960 ?Today's Date: 12/24/2021 ? ?Rec Therapy Plan ?Is patient appropriate for Therapeutic Recreation?: Yes ?Treatment times per week: at least 3 ?Estimated Length of Stay: 5-7 days ?TR Treatment/Interventions: Group participation (Comment) ? ?Discharge Criteria ?Pt will be discharged from therapy if:: Discharged ?Treatment plan/goals/alternatives discussed and agreed upon by:: Patient/family ? ?Discharge Summary ?  ? ? ?Heather Streeper ?12/24/2021, 2:46 PM ?

## 2021-12-24 NOTE — Group Note (Signed)
[facility] LCSW Group Therapy Note ? ? ?Group Date: 12/24/2021 ?Start Time: 1300 ?End Time: 1400 ? ? ?Type of Therapy/Topic:  Group Therapy:  Balance in Life ? ?Participation Level:  Active  ? ?Description of Group:   ? This group will address the concept of balance and how it feels and looks when one is unbalanced. Patients will be encouraged to process areas in their lives that are out of balance, and identify reasons for remaining unbalanced. Facilitators will guide patients utilizing problem- solving interventions to address and correct the stressor making their life unbalanced. Understanding and applying boundaries will be explored and addressed for obtaining  and maintaining a balanced life. Patients will be encouraged to explore ways to assertively make their unbalanced needs known to significant others in their lives, using other group members and facilitator for support and feedback. ? ?Therapeutic Goals: ?Patient will identify two or more emotions or situations they have that consume much of in their lives. ?Patient will identify signs/triggers that life has become out of balance:  ?Patient will identify two ways to set boundaries in order to achieve balance in their lives:  ?Patient will demonstrate ability to communicate their needs through discussion and/or role plays ? ?Summary of Patient Progress: ? ? ? ?Patient was present for the entirety of the group session. Patient was an active listener and participated in the topic of discussion, provided helpful advice to others, and added nuance to topic of conversation. Patient shared that he would like to implement a schedule so that he could better identify when he is unbalanced. CSW reinforced patient's post discharge plans.  ? ? ? ?Therapeutic Modalities:   ?Cognitive Behavioral Therapy ?Solution-Focused Therapy ?Assertiveness Training ? ? ?Corky Crafts, LCSWA ?

## 2021-12-25 DIAGNOSIS — F29 Unspecified psychosis not due to a substance or known physiological condition: Secondary | ICD-10-CM | POA: Diagnosis not present

## 2021-12-25 MED ORDER — BENZTROPINE MESYLATE 0.5 MG PO TABS: 0.5000 mg | ORAL_TABLET | Freq: Every day | ORAL | 2 refills | Status: DC

## 2021-12-25 MED ORDER — HALOPERIDOL 2 MG PO TABS: 2.0000 mg | ORAL_TABLET | Freq: Two times a day (BID) | ORAL | 2 refills | Status: DC

## 2021-12-25 NOTE — Progress Notes (Signed)
Patient ID: Jeremy [name], male   DOB: 1961-01-06, 61 y.o.   MRN: [id number] ?Patient denies SI/HI/AVH. Belongings were returned to patient. Discharge instructions  including medication and follow up information were reviewed with patient and understanding was verbalized. Patient was not observed to be in distress at time of discharge. Patient was escorted out with staff to medical mall to be [facility] to [facility] [employer]. ?

## 2021-12-25 NOTE — Discharge Summary (Signed)
Physician Discharge Summary Note ? ?Patient:  Jeremy [name] is an 61 y.o., male ?MRN:  [id number] ?DOB:  17-Jan-1961 ?Patient phone:  [phone] (home)  ?Patient address:   ?[address] ?[place] [address],  ?Total Time spent with patient: 30 minutes ? ?Date of Admission:  12/21/2021 ?Date of Discharge: 12/25/2021 ? ?Reason for Admission: Admitted after presenting to the emergency room with psychotic symptoms in the context of ongoing chronic abuse of stimulants accompanied by some aggressive thoughts. ? ?Principal Problem: Psychosis (HCC) ?Discharge Diagnoses: Principal Problem: ?  Psychosis (HCC) ?Active Problems: ?  Substance induced mood disorder (HCC) ?  Cocaine abuse (HCC) ? ? ?Past Psychiatric History: Past history of severe chronic substance abuse with daily cocaine use intermittent amphetamine abuse.  He has had sobriety and been in programs in the past but is not currently active in any treatment he does not have a past history of a diagnosis of a psychotic disorder ? ?Past Medical History: History reviewed. No pertinent past medical history. History reviewed. No pertinent surgical history. ?Family History: History reviewed. No pertinent family history. ?Family Psychiatric  History: None reported ?Social History:  ?Social History  ? ?Substance and Sexual Activity  ?Alcohol Use Yes  ? Comment: per pt he drinks a pint daily  ?   ?Social History  ? ?Substance and Sexual Activity  ?Drug Use Yes  ? Types: Cocaine  ?  ?Social History  ? ?Socioeconomic History  ? Marital status: Single  ?  Spouse name: Not on file  ? Number of children: Not on file  ? Years of education: Not on file  ? Highest education level: Not on file  ?Occupational History  ? Not on file  ?Tobacco Use  ? Smoking status: Never  ? Smokeless tobacco: Never  ?Vaping Use  ? Vaping Use: Never used  ?Substance and Sexual Activity  ? Alcohol use: Yes  ?  Comment: per pt he drinks a pint daily  ? Drug use: Yes  ?  Types: Cocaine  ? Sexual  activity: Not Currently  ?Other Topics Concern  ? Not on file  ?Social History Narrative  ? Not on file  ? ?Social Determinants of Health  ? ?Financial Resource Strain: Not on file  ?Food Insecurity: Not on file  ?Transportation Needs: Not on file  ?Physical Activity: Not on file  ?Stress: Not on file  ?Social Connections: Not on file  ? ? ?Hospital Course: Patient admitted to psychiatric unit and started on haloperidol 2 mg twice a day.  Most likely diagnosis appeared from the beginning to be substance induced psychosis and indeed over the next day all of his symptoms have cleared up.  He denies having any hallucinations or feeling paranoid.  Totally denies any thoughts of hurting himself or anyone else.  I have spoken with him and offered psychoeducation and support and specifically advised him that continued abuse of stimulants whether cocaine or amphetamines is likely to make the hallucinations and paranoia recur and cause other severe mental health and physical problems.  He has been offered the opportunity to get into rehab programs and has declined.  At this time he no longer meets commitment criteria and is asking for discharge even though he has been told there is no guarantee of a shelter bed tonight.  Also been told that at this late point in the afternoon we cannot get a 7-day supply of his medicine but will give him prescriptions for the inexpensive medicine.  Patient agreeable.  Encourage follow-up with our HA ? ?Physical Findings: ?AIMS:  , ,  ,  ,    ?CIWA:    ?COWS:    ? ?Musculoskeletal: ?Strength & Muscle Tone: within normal limits ?Gait & Station: normal ?Patient leans: N/A ? ? ?Psychiatric Specialty Exam: ? ?Presentation  ?General Appearance: Casual ? ?Eye Contact:Fair ? ?Speech:Clear and Coherent ? ?Speech Volume:Normal ? ?Handedness:No data recorded ? ?Mood and Affect  ?Mood:Dysphoric ? ?Affect:Congruent ? ? ?Thought Process  ?Thought Processes:Coherent ? ?Descriptions of  Associations:Intact ? ?Orientation:Full (Time, Place and Person) ? ?Thought Content:Logical ? ?History of Schizophrenia/Schizoaffective disorder:No ? ?Duration of Psychotic Symptoms:N/A ? ?Hallucinations:No data recorded ?Ideas of Reference:Paranoia ("I see people following me in cars") ? ?Suicidal Thoughts:No data recorded ?Homicidal Thoughts:No data recorded ? ?Sensorium  ?Memory:Immediate Fair ? ?Judgment:Poor ? ?Insight:No data recorded ? ?Executive Functions  ?Concentration:Fair ? ?Attention Span:Fair ? ?Recall:Fair ? ?Fund of Knowledge:Fair ? ?Language:Fair ? ? ?Psychomotor Activity  ?Psychomotor Activity:No data recorded ? ?Assets  ?Assets:Resilience ? ? ?Sleep  ?Sleep:No data recorded ? ? ?Physical Exam: ?Physical Exam ?Vitals and nursing note reviewed.  ?Constitutional:   ?   Appearance: Normal appearance.  ?HENT:  ?   Head: Normocephalic and atraumatic.  ?   Mouth/Throat:  ?   Pharynx: Oropharynx is clear.  ?Eyes:  ?   Pupils: Pupils are equal, round, and reactive to light.  ?Cardiovascular:  ?   Rate and Rhythm: Normal rate and regular rhythm.  ?Pulmonary:  ?   Effort: Pulmonary effort is normal.  ?   Breath sounds: Normal breath sounds.  ?Abdominal:  ?   General: Abdomen is flat.  ?   Palpations: Abdomen is soft.  ?Musculoskeletal:     ?   General: Normal range of motion.  ?Skin: ?   General: Skin is warm and dry.  ?Neurological:  ?   General: No focal deficit present.  ?   Mental Status: He is alert. Mental status is at baseline.  ?Psychiatric:     ?   Mood and Affect: Mood normal.     ?   Thought Content: Thought content normal.  ? ?Review of Systems  ?Constitutional: Negative.   ?HENT: Negative.    ?Eyes: Negative.   ?Respiratory: Negative.    ?Cardiovascular: Negative.   ?Gastrointestinal: Negative.   ?Musculoskeletal: Negative.   ?Skin: Negative.   ?Neurological: Negative.   ?Psychiatric/Behavioral:  Positive for substance abuse. Negative for depression, hallucinations and suicidal ideas. The  patient is not nervous/anxious.   ?Blood pressure 108/69, pulse 67, temperature 98.5 ?F (36.9 ?C), temperature source Oral, resp. rate 17, height 5\' 9"  (1.753 m), weight 93.4 kg, SpO2 97 %. Body mass index is 30.42 kg/m?. ? ? ?Social History  ? ?Tobacco Use  ?Smoking Status Never  ?Smokeless Tobacco Never  ? ?Tobacco Cessation:  N/A, patient does not currently use tobacco products ? ? ?Blood Alcohol level:  ?Lab Results  ?Component Value Date  ? ETH <10 12/20/2021  ? ETH <10 09/21/2021  ? ? ?Metabolic Disorder Labs:  ?Lab Results  ?Component Value Date  ? HGBA1C 5.6 12/23/2021  ? MPG 114 12/23/2021  ? ?No results found for: PROLACTIN ?Lab Results  ?Component Value Date  ? CHOL 179 12/23/2021  ? TRIG 178 (H) 12/23/2021  ? HDL 49 12/23/2021  ? CHOLHDL 3.7 12/23/2021  ? VLDL 36 12/23/2021  ? LDLCALC 94 12/23/2021  ? ? ?See Psychiatric Specialty Exam and Suicide Risk Assessment completed by Attending Physician prior to discharge. ? ?Discharge  destination:  Home ? ?Is patient on multiple antipsychotic therapies at discharge:  No   ?Has Patient had three or more failed trials of antipsychotic monotherapy by history:  No ? ?Recommended Plan for Multiple Antipsychotic Therapies: ?NA ? ?Discharge Instructions   ? ? Diet - low sodium heart healthy   Complete by: As directed ?  ? Increase activity slowly   Complete by: As directed ?  ? ?  ? ?Allergies as of 12/25/2021   ?No Known Allergies ?  ? ?  ?Medication List  ?  ? ?TAKE these medications   ? ?  Indication  ?benztropine 0.5 MG tablet ?Commonly known as: COGENTIN ?Take 1 tablet (0.5 mg total) by mouth daily. ?Start taking on: December 26, 2021 ? Indication: Extrapyramidal Reaction caused by Medications ?  ?haloperidol 2 MG tablet ?Commonly known as: HALDOL ?Take 1 tablet (2 mg total) by mouth 2 (two) times daily. ? Indication: Psychosis ?  ? ?  ? ? ? ?Follow-up recommendations: Follow-up strongly advised and Rh a ? ?Comments: Prescriptions provided ? ?Signed: ?Mordecai RasmussenJohn Rosalynd Mcwright,  MD ?12/25/2021, 3:03 PM ? ? ? ? ? ? ? ?

## 2021-12-25 NOTE — Plan of Care (Signed)
?  Problem: Education: ?Goal: Knowledge of [facility] Education information/materials will improve ?12/25/2021 1511 by Hyman Hopes, RN ?Outcome: Adequate for Discharge ?12/25/2021 1005 by Hyman Hopes, RN ?Outcome: Progressing ?Goal: Mental status will improve ?12/25/2021 1511 by Hyman Hopes, RN ?Outcome: Adequate for Discharge ?12/25/2021 1005 by Hyman Hopes, RN ?Outcome: Progressing ?Goal: Verbalization of understanding the information provided will improve ?12/25/2021 1511 by Hyman Hopes, RN ?Outcome: Adequate for Discharge ?12/25/2021 1005 by Hyman Hopes, RN ?Outcome: Progressing ?  ?Problem: Health Behavior/Discharge Planning: ?Goal: Identification of resources available to assist in meeting health care needs will improve ?Outcome: Adequate for Discharge ?Goal: Compliance with treatment plan for underlying cause of condition will improve ?12/25/2021 1511 by Hyman Hopes, RN ?Outcome: Adequate for Discharge ?12/25/2021 1005 by Hyman Hopes, RN ?Outcome: Progressing ?  ?Problem: Coping: ?Goal: Ability to identify and develop effective coping behavior will improve ?Outcome: Adequate for Discharge ?Goal: Ability to interact with others will improve ?12/25/2021 1511 by Hyman Hopes, RN ?Outcome: Adequate for Discharge ?12/25/2021 1005 by Hyman Hopes, RN ?Outcome: Progressing ?Goal: Demonstration of participation in decision-making regarding own care will improve ?Outcome: Adequate for Discharge ?Goal: Ability to use eye contact when communicating with others will improve ?Outcome: Adequate for Discharge ?  ?Problem: Education: ?Goal: Knowledge of disease or condition will improve ?12/25/2021 1511 by Hyman Hopes, RN ?Outcome: Adequate for Discharge ?12/25/2021 1005 by Hyman Hopes, RN ?Outcome: Progressing ?Goal: Understanding of discharge needs will improve ?12/25/2021 1511 by Hyman Hopes, RN ?Outcome: Adequate for Discharge ?12/25/2021 1005 by Hyman Hopes,  RN ?Outcome: Progressing ?  ?Problem: Safety: ?Goal: Ability to remain free from injury will improve ?12/25/2021 1511 by Hyman Hopes, RN ?Outcome: Adequate for Discharge ?12/25/2021 1005 by Hyman Hopes, RN ?Outcome: Progressing ?  ?Problem: Group Participation ?Goal: STG - Patient will engage in groups without prompting or encouragement from LRT x3 group sessions within 5 recreation therapy group sessions ?Description: STG - Patient will engage in groups without prompting or encouragement from LRT x3 group sessions within 5 recreation therapy group sessions ?Outcome: Adequate for Discharge ?  ?

## 2021-12-25 NOTE — Progress Notes (Signed)
Recreation Therapy Notes ? ?Date: 12/25/2021 ? ?Time: 10:40 am   ? ?Location: Craft room   ? ?Behavioral response: Appropriate ? ?Intervention Topic: Self-care  ? ?Discussion/Intervention:  ?Group content today was focused on Self-Care. The group defined self-care and some positive ways they care for themselves. Individuals expressed ways and reasons why they neglected any self-care in the past. Patients described ways to improve self-care in the future. The group explained what could happen if they did not do any self-care activities at all. The group participated in the intervention ?self-care assessment? where they had a chance to discover some of their weaknesses and strengths in self- care. Patient came up with a self-care plan to improve themselves in the future.  ?Clinical Observations/Feedback: ?Patient came to group and defined self-care as mental and physical health and using a routine that works. He rated his self-care skill a 4/10 expressing he could work on his social self-care and not isolating so much. Participant identified he could improve his self-care by using resources available and asking for help. Individual was social with peers and staff while participating in the intervention.    ?Jeremy [name] LRT/CTRS  ? ? ? ? ? ? ? ?Jeremy [name] ?12/25/2021 12:42 PM ?

## 2021-12-25 NOTE — Group Note (Signed)
[facility] LCSW Group Therapy Note ? ? ?Group Date: 12/25/2021 ?Start Time: 1300 ?End Time: 1400 ? ?Type of Therapy and Topic:  Group Therapy:  Feelings around Relapse and Recovery ? ?Participation Level:  Active  ? ?Mood: Depressed  ? ?Description of Group:   ? Patients in this group will discuss emotions they experience before and after a relapse. They will process how experiencing these feelings, or avoidance of experiencing them, relates to having a relapse. Facilitator will guide patients to explore emotions they have related to recovery. Patients will be encouraged to process which emotions are more powerful. They will be guided to discuss the emotional reaction significant others in their lives may have to patients? relapse or recovery. Patients will be assisted in exploring ways to respond to the emotions of others without this contributing to a relapse. ? ?Therapeutic Goals: ?Patient will identify two or more emotions that lead to relapse for them:  ?Patient will identify two emotions that result when they relapse:  ?Patient will identify two emotions related to recovery:  ?Patient will demonstrate ability to communicate their needs through discussion and/or role plays. ? ? ?Summary of Patient Progress: ? ?Patient was present for the entirety of the group session. Patient was an active listener and participated in the topic of discussion, provided helpful advice to others, and added nuance to topic of conversation. Patient shared he began to notice he had a problem when it started to affect his life (work, relationships, etc.). Patient appeared to be interested in what others had to say and reflected on his own addiction.  ? ? ?Therapeutic Modalities:   ?Cognitive Behavioral Therapy ?Solution-Focused Therapy ?Assertiveness Training ?Relapse Prevention Therapy ? ? ?Durenda Hurt, LCSWA ?

## 2021-12-25 NOTE — Progress Notes (Addendum)
?  [facility] Adult Case Management Discharge Plan : ? ?Will you be returning to the same living situation after discharge:  No. Patient intends to live with brother after discharge. Patient has been advised that it would be best for him to stay in the hospital; patient requests to leave AMA. Patient does not meet criteria for involuntary commitment.  ? ?At discharge, do you have transportation home?: Yes,  CSW provided nursing with cab voucher. ? ?Do you have the ability to pay for your medications: No. Patient has no listed insurance.  ? ?Release of information consent forms completed and in the chart;  Patient's signature needed at discharge. ? ?Patient to Follow up at: ? Follow-up Information   ? ? [facility]. Call.   ?Why: Please present for walkin clinic mon-fri at 0730 am. If you need to be seen immediately, call 911 or present to the nearest emergency room. ?Contact information: ?[address] ?[place] [address] ?[phone] ? ? ?  ?  ? ?  ?  ? ?  ?Patient discharged without scheduled appointment; clinic not open due to observance of Good Friday holiday.  ? ?Next level of care provider has access to [facility] ? ?Safety Planning and Suicide Prevention discussed: Yes,  SPE completed with patient, denied consent for CSW to reach family/friend. ?  ?Has patient been referred to the Quitline?: N/A patient is not a smoker Patient screened  ? ?Patient has been referred for addiction treatment: Pt. refused referral Patient requesting to leave AMA prior to CSW being able to schedule an appointment for SUD tx. CSW has placed information in discharge instructions for patient to follow up with outpatient services. CSW has notified [facility] peer support specialist of patient's discharge w/ written consent by patient.  ? ?Durenda Hurt, LCSWA ?12/25/2021, 3:50 PM ?

## 2021-12-25 NOTE — Progress Notes (Signed)
Recreation Therapy Notes ? ?INPATIENT RECREATION TR PLAN ? ?Patient Details ?Name: Jeremy [name] ?MRN: [id number] ?DOB: 1961/08/15 ?Today's Date: 12/25/2021 ? ?Rec Therapy Plan ?Is patient appropriate for Therapeutic Recreation?: Yes ?Treatment times per week: at least 3 ?Estimated Length of Stay: 5-7 days ?TR Treatment/Interventions: Group participation (Comment) ? ?Discharge Criteria ?Pt will be discharged from therapy if:: Discharged ?Treatment plan/goals/alternatives discussed and agreed upon by:: Patient/family ? ?Discharge Summary ?Short term goals set: Patient will engage in groups without prompting or encouragement from LRT x3 group sessions within 5 recreation therapy group sessions ?Short term goals met: Complete ?Progress toward goals comments: Groups attended ?Which groups?: AAA/T, Other (Comment) (Self-care, Time Management) ?Reason goals not met: N/A ?Therapeutic equipment acquired: N/A ?Reason patient discharged from therapy: Discharge from hospital ?Pt/family agrees with progress & goals achieved: Yes ?Date patient discharged from therapy: 12/25/21 ? ? ?Haile Toppins ?12/25/2021, 4:14 PM ?

## 2021-12-25 NOTE — Plan of Care (Signed)
?  Problem: Education: ?Goal: Knowledge of [facility] Education information/materials will improve ?Outcome: Progressing ?Goal: Mental status will improve ?Outcome: Progressing ?Goal: Verbalization of understanding the information provided will improve ?Outcome: Progressing ?  ?Problem: Health Behavior/Discharge Planning: ?Goal: Compliance with treatment plan for underlying cause of condition will improve ?Outcome: Progressing ?  ?Problem: Coping: ?Goal: Ability to interact with others will improve ?Outcome: Progressing ?  ?Problem: Education: ?Goal: Knowledge of disease or condition will improve ?Outcome: Progressing ?Goal: Understanding of discharge needs will improve ?Outcome: Progressing ?  ?Problem: Safety: ?Goal: Ability to remain free from injury will improve ?Outcome: Progressing ?  ?

## 2021-12-25 NOTE — [facility] Suicide Risk Assessment (Signed)
[facility] Discharge Suicide Risk Assessment ? ? ?Principal Problem: Psychosis ([place]) ?Discharge Diagnoses: Principal Problem: ?  Psychosis ([place]) ?Active Problems: ?  Substance induced mood disorder ([place]) ?  Cocaine abuse ([place]) ? ? ?Total Time spent with patient: 30 minutes ? ?Musculoskeletal: ?Strength & Muscle Tone: within normal limits ?Gait & Station: normal ?Patient leans: N/A ? ?Psychiatric Specialty Exam ? ?Presentation  ?General Appearance: Casual ? ?Eye Contact:Fair ? ?Speech:Clear and Coherent ? ?Speech Volume:Normal ? ?Handedness:No data recorded ? ?Mood and Affect  ?Mood:Dysphoric ? ?Duration of Depression Symptoms: No data recorded ?Affect:Congruent ? ? ?Thought Process  ?Thought Processes:Coherent ? ?Descriptions of Associations:Intact ? ?Orientation:Full (Time, Place and Person) ? ?Thought Content:Logical ? ?History of Schizophrenia/Schizoaffective disorder:No ? ?Duration of Psychotic Symptoms:N/A ? ?Hallucinations:No data recorded ?Ideas of Reference:Paranoia ("I see people following me in cars") ? ?Suicidal Thoughts:No data recorded ?Homicidal Thoughts:No data recorded ? ?Sensorium  ?Memory:Immediate Fair ? ?Judgment:Poor ? ?Insight:No data recorded ? ?Executive Functions  ?Concentration:Fair ? ?Attention Span:Fair ? ?Recall:Fair ? ?[place] ? ?Language:Fair ? ? ?Psychomotor Activity  ?Psychomotor Activity:No data recorded ? ?Assets  ?Assets:Resilience ? ? ?Sleep  ?Sleep:No data recorded ? ?Physical Exam: ?Physical Exam ?Vitals and nursing note reviewed.  ?Constitutional:   ?   Appearance: Normal appearance.  ?HENT:  ?   Head: Normocephalic and atraumatic.  ?   Mouth/Throat:  ?   Pharynx: Oropharynx is clear.  ?Eyes:  ?   Pupils: Pupils are equal, round, and reactive to light.  ?Cardiovascular:  ?   Rate and Rhythm: Normal rate and regular rhythm.  ?Pulmonary:  ?   Effort: Pulmonary effort is normal.  ?   Breath sounds: Normal breath sounds.  ?Abdominal:  ?   General: Abdomen is flat.  ?    Palpations: Abdomen is soft.  ?Musculoskeletal:     ?   General: Normal range of motion.  ?Skin: ?   General: Skin is warm and dry.  ?Neurological:  ?   General: No focal deficit present.  ?   Mental Status: He is alert. Mental status is at baseline.  ?Psychiatric:     ?   Attention and Perception: Attention normal.     ?   Mood and Affect: Mood normal.     ?   Speech: Speech normal.     ?   Behavior: Behavior is cooperative.     ?   Thought Content: Thought content normal.     ?   Cognition and Memory: Cognition normal.     ?   Judgment: Judgment normal.  ? ?Review of Systems  ?Constitutional: Negative.   ?HENT: Negative.    ?Eyes: Negative.   ?Respiratory: Negative.    ?Cardiovascular: Negative.   ?Gastrointestinal: Negative.   ?Musculoskeletal: Negative.   ?Skin: Negative.   ?Neurological: Negative.   ?Psychiatric/Behavioral:  Positive for substance abuse. Negative for depression, hallucinations and suicidal ideas. The patient is not nervous/anxious and does not have insomnia.   ?Blood pressure 108/69, pulse 67, temperature 98.5 ?F (36.9 ?C), temperature source Oral, resp. rate 17, height 5\' 9"  (1.753 m), weight 93.4 kg, SpO2 97 %. Body mass index is 30.42 kg/m?. ? ?Mental Status Per Nursing Assessment::   ?On Admission:  Thoughts of violence towards others, Intention to act on plan to harm others ? ?Demographic Factors:  ?Male and Low socioeconomic status ? ?Loss Factors: ?Financial problems/change in socioeconomic status ? ?Historical Factors: ?Impulsivity ? ?Risk Reduction Factors:   ?NA ? ?Continued Clinical Symptoms:  ?Alcohol/Substance Abuse/Dependencies ? ?  Cognitive Features That Contribute To Risk:  ?Thought constriction (tunnel vision)   ? ?Suicide Risk:  ?Minimal: No identifiable suicidal ideation.  Patients presenting with no risk factors but with morbid ruminations; may be classified as minimal risk based on the severity of the depressive symptoms ? ? ? ?Plan Of Care/Follow-up recommendations:   ?Patient denies any suicidal or homicidal ideation and is not showing any behavior problems and does not appear to be showing any signs of acute psychosis.  He is strongly requesting discharge.  He is referred to our HA for follow-up for substance abuse treatment and will be given a prescription of medicine.  No indication of any acute danger to himself at this time. ? ?Alethia Berthold, MD ?12/25/2021, 3:00 PM ?

## 2022-02-06 ENCOUNTER — Emergency Department
Admission: EM | Admit: 2022-02-06 | Discharge: 2022-02-06 | Disposition: A | Discharge status: Home / Self Care | Payer: 59 | Attending: Emergency Medicine | Admitting: Emergency Medicine

## 2022-02-06 ENCOUNTER — Other Ambulatory Visit: Payer: Self-pay

## 2022-02-06 DIAGNOSIS — F1494 Cocaine use, unspecified with cocaine-induced mood disorder: Secondary | ICD-10-CM | POA: Diagnosis present

## 2022-02-06 DIAGNOSIS — F1410 Cocaine abuse, uncomplicated: Secondary | ICD-10-CM | POA: Diagnosis present

## 2022-02-06 DIAGNOSIS — F1414 Cocaine abuse with cocaine-induced mood disorder: Secondary | ICD-10-CM | POA: Insufficient documentation

## 2022-02-06 DIAGNOSIS — F209 Schizophrenia, unspecified: Secondary | ICD-10-CM | POA: Insufficient documentation

## 2022-02-06 HISTORY — DX: Schizophrenia, unspecified: F20.9

## 2022-02-06 LAB — COMPREHENSIVE METABOLIC PANEL
ALT: 25 U/L (ref 0–44)
AST: 34 U/L (ref 15–41)
Albumin: 3.6 g/dL (ref 3.5–5.0)
Alkaline Phosphatase: 83 U/L (ref 38–126)
Anion gap: 4 — ABNORMAL LOW (ref 5–15)
BUN: 15 mg/dL (ref 6–20)
CO2: 27 mmol/L (ref 22–32)
Calcium: 9 mg/dL (ref 8.9–10.3)
Chloride: 110 mmol/L (ref 98–111)
Creatinine, Ser: 1.38 mg/dL — ABNORMAL HIGH (ref 0.61–1.24)
GFR, Estimated: 59 mL/min — ABNORMAL LOW (ref >60)
Glucose, Bld: 77 mg/dL (ref 70–99)
Potassium: 4 mmol/L (ref 3.5–5.1)
Sodium: 141 mmol/L (ref 135–145)
Total Bilirubin: 0.6 mg/dL (ref 0.3–1.2)
Total Protein: 6.5 g/dL (ref 6.5–8.1)

## 2022-02-06 LAB — URINE DRUG SCREEN, QUALITATIVE (ARMC ONLY)
Amphetamines, Ur Screen: NOT DETECTED
Barbiturates, Ur Screen: NOT DETECTED
Benzodiazepine, Ur Scrn: NOT DETECTED
Cannabinoid 50 Ng, Ur ~~LOC~~: NOT DETECTED
Cocaine Metabolite,Ur ~~LOC~~: POSITIVE — AB
MDMA (Ecstasy)Ur Screen: NOT DETECTED
Methadone Scn, Ur: NOT DETECTED
Opiate, Ur Screen: NOT DETECTED
Phencyclidine (PCP) Ur S: NOT DETECTED
Tricyclic, Ur Screen: NOT DETECTED

## 2022-02-06 LAB — CBC
HCT: 40.9 % (ref 39.0–52.0)
Hemoglobin: 13.1 g/dL (ref 13.0–17.0)
MCH: 30.5 pg (ref 26.0–34.0)
MCHC: 32 g/dL (ref 30.0–36.0)
MCV: 95.3 fL (ref 80.0–100.0)
Platelets: 282 10*3/uL (ref 150–400)
RBC: 4.29 MIL/uL (ref 4.22–5.81)
RDW: 14.6 % (ref 11.5–15.5)
WBC: 4.6 10*3/uL (ref 4.0–10.5)
nRBC: 0 % (ref 0.0–0.2)

## 2022-02-06 LAB — ACETAMINOPHEN LEVEL: Acetaminophen (Tylenol), Serum: <10 ug/mL — ABNORMAL LOW (ref 10–30)

## 2022-02-06 LAB — ETHANOL: Alcohol, Ethyl (B): <10 mg/dL (ref <10)

## 2022-02-06 LAB — SALICYLATE LEVEL: Salicylate Lvl: <7 mg/dL — ABNORMAL LOW (ref 7.0–30.0)

## 2022-02-06 MED ORDER — BENZTROPINE MESYLATE 1 MG PO TABS
0.5000 mg | ORAL_TABLET | Freq: Every day | ORAL | Status: DC
  Administered 2022-02-06: 0.5 mg via ORAL
  Filled 2022-02-06: qty 1

## 2022-02-06 MED ORDER — HALOPERIDOL 2 MG PO TABS
2.0000 mg | ORAL_TABLET | Freq: Two times a day (BID) | ORAL | Status: DC
  Administered 2022-02-06: 2 mg via ORAL
  Filled 2022-02-06 (×2): qty 1

## 2022-02-06 NOTE — Consult Note (Signed)
[facility] Consult   Reason for Consult:  cocaine abuse Referring Physician:  EDP Patient Identification: Jeremy [name] MRN:  [id number] Principal Diagnosis: Cocaine-induced mood disorder ([place]) Diagnosis:  Principal Problem:   Cocaine-induced mood disorder ([place]) Active Problems:   Cocaine abuse ([place])   Total Time spent with patient: 45 minutes  Subjective:   Jeremy [name] is a 61 y.o. male patient admitted with cocaine abuse.  HPI:  61 yo male who presented to the ED to get back on his medications with passive suicidal ideations at times.  He does NOT have a diagnosis of schizophrenia per the chart and Dr. Weber Cooks.  He reports using cocaine twice weekly, no withdrawal symptoms.  On assessment with TTS, no suicidal/homicidal ideations, hallucinations.  His biggest stressor is being homeless.  His medications will be restarted and discharged tomorrow with resources.  Past Psychiatric History: cocaine abuse  Risk to Self:  none Risk to Others:  none Prior Inpatient Therapy:  many detox/rehabs Prior Outpatient Therapy:  past history  Past Medical History:  Past Medical History:  Diagnosis Date   Schizophrenia ([place])    History reviewed. No pertinent surgical history. Family History: No family history on file. Family Psychiatric  History: none Social History:  Social History   Substance and Sexual Activity  Alcohol Use Yes   Comment: per pt he drinks a pint daily     Social History   Substance and Sexual Activity  Drug Use Yes   Types: Cocaine    Social History   Socioeconomic History   Marital status: Single    Spouse name: Not on file   Number of children: Not on file   Years of education: Not on file   Highest education level: Not on file  Occupational History   Not on file  Tobacco Use   Smoking status: Never   Smokeless tobacco: Never  Vaping Use   Vaping Use: Never used  Substance and Sexual Activity   Alcohol use: Yes     Comment: per pt he drinks a pint daily   Drug use: Yes    Types: Cocaine   Sexual activity: Not Currently  Other Topics Concern   Not on file  Social History Narrative   Not on file   Social Determinants of Health   Financial Resource Strain: Not on file  Food Insecurity: Not on file  Transportation Needs: Not on file  Physical Activity: Not on file  Stress: Not on file  Social Connections: Not on file   Additional Social History:    Allergies:  No Known Allergies  Labs:  Results for orders placed or performed during the hospital encounter of 02/06/22 (from the past 48 hour(s))  Comprehensive metabolic panel     Status: Abnormal   Collection Time: 02/06/22  2:42 PM  Result Value Ref Range   Sodium 141 135 - 145 mmol/L   Potassium 4.0 3.5 - 5.1 mmol/L   Chloride 110 98 - 111 mmol/L   CO2 27 22 - 32 mmol/L   Glucose, Bld 77 70 - 99 mg/dL    Comment: Glucose reference range applies only to samples taken after fasting for at least 8 hours.   BUN 15 6 - 20 mg/dL   Creatinine, Ser 1.38 (H) 0.61 - 1.24 mg/dL   Calcium 9.0 8.9 - 10.3 mg/dL   Total Protein 6.5 6.5 - 8.1 g/dL   Albumin 3.6 3.5 - 5.0 g/dL   AST 34 15 - 41 U/L  ALT 25 0 - 44 U/L   Alkaline Phosphatase 83 38 - 126 U/L   Total Bilirubin 0.6 0.3 - 1.2 mg/dL   GFR, Estimated 59 (L) >60 mL/min    Comment: (NOTE) Calculated using the CKD-EPI Creatinine Equation (2021)    Anion gap 4 (L) 5 - 15    Comment: Performed at [facility], [address] [place], [place] [address]  Ethanol     Status: None   Collection Time: 02/06/22  2:42 PM  Result Value Ref Range   Alcohol, Ethyl (B) <10 <10 mg/dL    Comment: (NOTE) Lowest detectable limit for serum alcohol is 10 mg/dL.  For medical purposes only. Performed at [facility], [address] [place], [place] [id number]   Salicylate level     Status: Abnormal   Collection Time: 02/06/22  2:42 PM  Result Value Ref Range   Salicylate Lvl [id number]  (L) 7.0 - 30.0 mg/dL    Comment: Performed at [facility], [address] [place], [place] [address]  Acetaminophen level     Status: Abnormal   Collection Time: 02/06/22  2:42 PM  Result Value Ref Range   Acetaminophen (Tylenol), Serum <10 (L) 10 - 30 ug/mL    Comment: (NOTE) Therapeutic concentrations vary significantly. A range of 10-30 ug/mL  may be an effective concentration for many patients. However, some  are best treated at concentrations outside of this range. Acetaminophen concentrations >150 ug/mL at 4 hours after ingestion  and >50 ug/mL at 12 hours after ingestion are often associated with  toxic reactions.  Performed at [facility], [address] [place], [place] [address]   cbc     Status: None   Collection Time: 02/06/22  2:42 PM  Result Value Ref Range   WBC 4.6 4.0 - 10.5 K/uL   RBC 4.29 4.22 - 5.81 MIL/uL   Hemoglobin 13.1 13.0 - 17.0 g/dL   HCT 40.9 39.0 - 52.0 %   MCV 95.3 80.0 - 100.0 fL   MCH 30.5 26.0 - 34.0 pg   MCHC 32.0 30.0 - 36.0 g/dL   RDW 14.6 11.5 - 15.5 %   Platelets 282 150 - 400 K/uL   nRBC 0.0 0.0 - 0.2 %    Comment: Performed at [facility], [address] [place], [place] [address]  Urine Drug Screen, Qualitative     Status: Abnormal   Collection Time: 02/06/22  2:42 PM  Result Value Ref Range   Tricyclic, Ur Screen NONE DETECTED NONE DETECTED   Amphetamines, Ur Screen NONE DETECTED NONE DETECTED   MDMA (Ecstasy)Ur Screen NONE DETECTED NONE DETECTED   Cocaine Metabolite,Ur [place] POSITIVE (A) NONE DETECTED   Opiate, Ur Screen NONE DETECTED NONE DETECTED   Phencyclidine (PCP) Ur S NONE DETECTED NONE DETECTED   Cannabinoid 50 Ng, Ur [place] NONE DETECTED NONE DETECTED   Barbiturates, Ur Screen NONE DETECTED NONE DETECTED   Benzodiazepine, Ur Scrn NONE DETECTED NONE DETECTED   Methadone Scn, Ur NONE DETECTED NONE DETECTED    Comment: (NOTE) Tricyclics + metabolites, urine    Cutoff 1000 ng/mL Amphetamines +  metabolites, urine  Cutoff 1000 ng/mL MDMA (Ecstasy), urine              Cutoff 500 ng/mL Cocaine Metabolite, urine          Cutoff 300 ng/mL Opiate + metabolites, urine        Cutoff 300 ng/mL Phencyclidine (PCP), urine  Cutoff 25 ng/mL Cannabinoid, urine                 Cutoff 50 ng/mL Barbiturates + metabolites, urine  Cutoff 200 ng/mL Benzodiazepine, urine              Cutoff 200 ng/mL Methadone, urine                   Cutoff 300 ng/mL  The urine drug screen provides only a preliminary, unconfirmed analytical test result and should not be used for non-medical purposes. Clinical consideration and professional judgment should be applied to any positive drug screen result due to possible interfering substances. A more specific alternate chemical method must be used in order to obtain a confirmed analytical result. Gas chromatography / mass spectrometry (GC/MS) is the preferred confirm atory method. Performed at [facility], [address] [place], [place] [address]     Current Facility-Administered Medications  Medication Dose Route Frequency Provider Last Rate Last Admin   benztropine (COGENTIN) tablet 0.5 mg  0.5 mg Oral Daily Carrie Mew, MD       haloperidol (HALDOL) tablet 2 mg  2 mg Oral BID Carrie Mew, MD       Current Outpatient Medications  Medication Sig Dispense Refill   benztropine (COGENTIN) 0.5 MG tablet Take 1 tablet (0.5 mg total) by mouth daily. 30 tablet 2   haloperidol (HALDOL) 2 MG tablet Take 1 tablet (2 mg total) by mouth 2 (two) times daily. 60 tablet 2    Musculoskeletal: Strength & Muscle Tone: within normal limits Gait & Station: normal Patient leans: N/A  Psychiatric Specialty Exam: Physical Exam Vitals and nursing note reviewed.  Constitutional:      Appearance: Normal appearance.  HENT:     Head: Normocephalic.     Nose: Nose normal.  Pulmonary:     Effort: Pulmonary effort is normal.  Musculoskeletal:         General: Normal range of motion.     Cervical back: Normal range of motion.  Neurological:     General: No focal deficit present.     Mental Status: He is alert and oriented to person, place, and time.  Psychiatric:        Attention and Perception: Attention and perception normal.        Mood and Affect: Mood is anxious. Affect is blunt.        Speech: Speech normal.        Behavior: Behavior normal. Behavior is cooperative.        Thought Content: Thought content normal.        Cognition and Memory: Cognition and memory normal.        Judgment: Judgment normal.    Review of Systems  Psychiatric/Behavioral:  Positive for substance abuse.   All other systems reviewed and are negative.  Blood pressure 129/62, pulse 69, temperature 98.7 F (37.1 C), temperature source Oral, resp. rate 20, SpO2 97 %.There is no height or weight on file to calculate BMI.  General Appearance: Casual  Eye Contact:  Good  Speech:  Normal Rate  Volume:  Normal  Mood:  Anxious  Affect:  Blunt  Thought Process:  Coherent and Descriptions of Associations: Intact  Orientation:  Full (Time, Place, and Person)  Thought Content:  WDL and Logical  Suicidal Thoughts:  No  Homicidal Thoughts:  No  Memory:  Immediate;   Good Recent;   Good Remote;   Good  Judgement:  Fair  Insight:  Fair  Psychomotor Activity:  Normal  Concentration:  Concentration: Good and Attention Span: Good  Recall:  Good  Fund of Knowledge:  Good  Language:  Good  Akathisia:  No  Handed:  Right  AIMS (if indicated):     Assets:  Leisure Time Physical Health Resilience  ADL's:  Intact  Cognition:  WNL  Sleep:        Physical Exam: Physical Exam Vitals and nursing note reviewed.  Constitutional:      Appearance: Normal appearance.  HENT:     Head: Normocephalic.     Nose: Nose normal.  Pulmonary:     Effort: Pulmonary effort is normal.  Musculoskeletal:        General: Normal range of motion.     Cervical back:  Normal range of motion.  Neurological:     General: No focal deficit present.     Mental Status: He is alert and oriented to person, place, and time.  Psychiatric:        Attention and Perception: Attention and perception normal.        Mood and Affect: Mood is anxious. Affect is blunt.        Speech: Speech normal.        Behavior: Behavior normal. Behavior is cooperative.        Thought Content: Thought content normal.        Cognition and Memory: Cognition and memory normal.        Judgment: Judgment normal.   Review of Systems  Psychiatric/Behavioral:  Positive for substance abuse.   All other systems reviewed and are negative. Blood pressure 129/62, pulse 69, temperature 98.7 F (37.1 C), temperature source Oral, resp. rate 20, SpO2 97 %. There is no height or weight on file to calculate BMI.  Treatment Plan Summary: Daily contact with patient to assess and evaluate symptoms and progress in treatment, Medication management, and Plan : Cocaine abuse with cocaine induced mood disorder: Restarted Haldol 2 mg BID  EPS: Restarted Cogentin 0.5 mg daily  Disposition: Patient does not meet criteria for psychiatric inpatient admission. Supportive therapy provided about ongoing stressors.  Waylan Boga, NP 02/06/2022 4:34 PM

## 2022-02-06 NOTE — ED Triage Notes (Addendum)
Pt states that he has a hx of schizophrenia and is having a hard time getting and staying on his medication- pt has also been homeless on and off- pt is here to get help and get back on his medication- when asked about suicidal ideations he states "I wanna get away from this world"- pt is having paranoid thoughts about people watching him and burning his tent down

## 2022-02-06 NOTE — ED Notes (Signed)
Pt. Transferred to ED , room# 23AA from triage .Patient was screened by security before entering the unit . Pt. Oriented to unit including Q15 minute rounds as well as the security cameras for their protection. Patient is alert and oriented, warm and dry in no acute distress.  urine sample provided

## 2022-02-06 NOTE — ED Notes (Signed)
Pt stated "I'm glad I'm in this room, I needed some time to myself more than anything.  I'm getting to old to be homeless and living on these streets."  Pt denies all SI/HI stated he has a hx of schizophrenia but denies any current auditory or visual hallucinations.

## 2022-02-06 NOTE — ED Notes (Signed)
Pt is expecting a phone call from the social security office on Monday the 23rd on his cell phone that has been rescheduled twice- pt has asked to have access to his phone to receive this call

## 2022-02-06 NOTE — ED Provider Notes (Signed)
[facility] Provider Note    Event Date/Time   First MD Initiated Contact with Patient 02/06/22 1511     (approximate)   History   Chief Complaint: Psychiatric Evaluation   HPI  Jeremy HUMBARGER is a 61 y.o. male with a history of cocaine induced substance disorder who comes the ED complaining of worsened auditory visual hallucinations over the past few days, reports that he lost his medication which is Haldol and Cogentin.  States that he feels paranoid that people are watching him and that everything bad that happens around him is due to people trying to get him.  He is currently homeless and sleeps in a tent in the woods.  No SI or HI.     Physical Exam   Triage Vital Signs: ED Triage Vitals  Enc Vitals Group     BP 02/06/22 1434 129/62     Pulse Rate 02/06/22 1434 69     Resp 02/06/22 1434 20     Temp 02/06/22 1434 98.7 F (37.1 C)     Temp Source 02/06/22 1434 Oral     SpO2 02/06/22 1434 97 %     Weight --      Height --      Head Circumference --      Peak Flow --      Pain Score 02/06/22 1438 0     Pain Loc --      Pain Edu? --      Excl. in GC? --     Most recent vital signs: Vitals:   02/06/22 1434  BP: 129/62  Pulse: 69  Resp: 20  Temp: 98.7 F (37.1 C)  SpO2: 97%    General: Awake, no distress.  CV:  Good peripheral perfusion.  Resp:  Normal effort.  Abd:  No distention.  Other:  Linear thought.  Intact insight.   ED Results / Procedures / Treatments   Labs (all labs ordered are listed, but only abnormal results are displayed) Labs Reviewed  COMPREHENSIVE METABOLIC PANEL - Abnormal; Notable for the following components:      Result Value   Creatinine, Ser 1.38 (*)    GFR, Estimated 59 (*)    Anion gap 4 (*)    All other components within normal limits  SALICYLATE LEVEL - Abnormal; Notable for the following components:   Salicylate Lvl <7.0 (*)    All other components within normal limits  ACETAMINOPHEN  LEVEL - Abnormal; Notable for the following components:   Acetaminophen (Tylenol), Serum <10 (*)    All other components within normal limits  URINE DRUG SCREEN, QUALITATIVE ([facility] ONLY) - Abnormal; Notable for the following components:   Cocaine Metabolite,Ur [place] POSITIVE (*)    All other components within normal limits  ETHANOL  CBC     EKG    RADIOLOGY    PROCEDURES:  Procedures   MEDICATIONS ORDERED IN ED: Medications  benztropine (COGENTIN) tablet 0.5 mg (has no administration in time range)  haloperidol (HALDOL) tablet 2 mg (has no administration in time range)     IMPRESSION / MDM / ASSESSMENT AND PLAN / ED COURSE  I reviewed the triage vital signs and the nursing notes.                              Differential diagnosis includes, but is not limited to, chronic schizophrenia, acute psychosis, drug use, electrolyte abnormality  Patient's presentation  is most consistent with exacerbation of chronic illness.  Patient presents with increased paranoia and hallucinations.  Currently patient is not floridly psychotic.  He has intact orientation and insight.  Not a danger to himself or others imminently, not committable.  I will request behavioral medicine evaluation and restart his Haldol and Cogentin here in the ED.  The patient has been placed in psychiatric observation due to the need to provide a safe environment for the patient while obtaining psychiatric consultation and evaluation, as well as ongoing medical and medication management to treat the patient's condition.  The patient has not been placed under full IVC at this time.        FINAL CLINICAL IMPRESSION(S) / ED DIAGNOSES   Final diagnoses:  Schizophrenia, unspecified type (HCC)     Rx / DC Orders   ED Discharge Orders     None        Note:  This document was prepared using Dragon voice recognition software and may include unintentional dictation errors.   Sharman Cheek, MD 02/06/22  1650

## 2022-02-06 NOTE — ED Notes (Signed)
Pt dressed out into burgundy scrubs, pt belongings to include:  1 black watch 1 grey ring 1 black hat 1 pair black tennis shoes 1 pair black socks 1 pair tan shorts 1 button up short sleeve shirt (blue, white, and tan) 1 pair blue underwear 1 wallet 1 brown belt 1 black grocery bag with cell phone, 2 pairs of shorts, 2 white tshirts, 1 pair underwear, 5 pairs socks, personal care items, 1 cell phone charger

## 2022-03-03 ENCOUNTER — Emergency Department: Payer: Medicaid Other

## 2022-03-03 ENCOUNTER — Emergency Department
Admission: EM | Admit: 2022-03-03 | Discharge: 2022-03-03 | Disposition: A | Discharge status: Home / Self Care | Payer: Medicaid Other | Attending: Emergency Medicine | Admitting: Emergency Medicine

## 2022-03-03 ENCOUNTER — Other Ambulatory Visit: Payer: Self-pay

## 2022-03-03 DIAGNOSIS — J180 Bronchopneumonia, unspecified organism: Secondary | ICD-10-CM | POA: Insufficient documentation

## 2022-03-03 DIAGNOSIS — Z20822 Contact with and (suspected) exposure to covid-19: Secondary | ICD-10-CM | POA: Insufficient documentation

## 2022-03-03 LAB — RESP PANEL BY RT-PCR (FLU A&B, COVID) ARPGX2
Influenza A by PCR: NEGATIVE
Influenza B by PCR: NEGATIVE
SARS Coronavirus 2 by RT PCR: NEGATIVE

## 2022-03-03 LAB — GROUP A STREP BY PCR: Group A Strep by PCR: NOT DETECTED

## 2022-03-03 MED ORDER — PSEUDOEPH-BROMPHEN-DM 30-2-10 MG/5ML PO SYRP: 5.0000 mL | ORAL_SOLUTION | Freq: Four times a day (QID) | ORAL | 0 refills | Status: DC | PRN

## 2022-03-03 MED ORDER — DOXYCYCLINE HYCLATE 100 MG PO CAPS: 100.0000 mg | ORAL_CAPSULE | Freq: Two times a day (BID) | ORAL | 0 refills | Status: DC

## 2022-03-03 NOTE — ED Provider Notes (Signed)
[facility] Provider Note    Event Date/Time   First MD Initiated Contact with Patient 03/03/22 [id number]     (approximate)   History   Chills and Cough   HPI  Jeremy [name] is a 61 y.o. male   presents to the ED with complaint of 5 days of chills, cough and body aches.  Patient also complains of sore throat.  He is unaware of any known exposure and no one else in his family is sick at this time.  Patient has taken over-the-counter medication without any relief.  Patient reports that he had pneumonia a year ago.  History of schizophrenia with substance abuse disorder.      Physical Exam   Triage Vital Signs: ED Triage Vitals  Enc Vitals Group     BP 03/03/22 0852 124/71     Pulse Rate 03/03/22 0852 63     Resp 03/03/22 0852 18     Temp 03/03/22 0852 98.4 F (36.9 C)     Temp Source 03/03/22 0852 Oral     SpO2 03/03/22 0852 95 %     Weight 03/03/22 0851 220 lb (99.8 kg)     Height 03/03/22 0851 5\' 9"  (1.753 m)     Head Circumference --      Peak Flow --      Pain Score 03/03/22 0851 10     Pain Loc --      Pain Edu? --      Excl. in GC? --     Most recent vital signs: Vitals:   03/03/22 0852 03/03/22 1148  BP: 124/71 98/63  Pulse: 63 77  Resp: 18 18  Temp: 98.4 F (36.9 C) 97.9 F (36.6 C)  SpO2: 95% 100%     General: Awake, no distress.  CV:  Good peripheral perfusion.  Heart regular rate and rhythm. Resp:  Normal effort.  Lungs are clear bilaterally. Abd:  No distention.  Other:  Posterior pharynx without erythema, injection or exudate.  Uvula is midline.  Neck is supple with out cervical lymphadenopathy.   ED Results / Procedures / Treatments   Labs (all labs ordered are listed, but only abnormal results are displayed) Labs Reviewed  RESP PANEL BY RT-PCR (FLU A&B, COVID) ARPGX2  GROUP A STREP BY PCR     RADIOLOGY Chest x-ray images were reviewed by me independent of the radiologist and increased markings was noted  especially on the right side.  Radiology report officially reads bronchopneumonia with mild patchy infiltrate noted in the left lower lobe.    PROCEDURES:  Critical Care performed:   Procedures   MEDICATIONS ORDERED IN ED: Medications - No data to display   IMPRESSION / MDM / ASSESSMENT AND PLAN / ED COURSE  I reviewed the triage vital signs and the nursing notes.   Differential diagnosis includes, but is not limited to, viral upper respiratory infection, COVID, influenza, strep pharyngitis, bronchitis, pneumonia.  61 year old male presents to the ED with complaint of cough, congestion, sore throat and body aches for 5 days.  Patient is also experienced chills.  He is unaware of any known exposure to to COVID no one else in the family is sick.  Lab work included respiratory panel with influenza and COVID-negative, strep negative however chest x-ray did show bronchopneumonia and patient was made aware.  Patient was placed on antibiotic to take for the next 10 days and is encouraged to follow-up with his primary care provider if any continued  problems or return to the emergency department if any severe worsening of his symptoms.      Patient's presentation is most consistent with acute illness / injury with system symptoms.  FINAL CLINICAL IMPRESSION(S) / ED DIAGNOSES   Final diagnoses:  Bronchopneumonia     Rx / DC Orders   ED Discharge Orders          Ordered    doxycycline (VIBRAMYCIN) 100 MG capsule  2 times daily        03/03/22 1127    brompheniramine-pseudoephedrine-DM 30-2-10 MG/5ML syrup  4 times daily PRN        03/03/22 1127             Note:  This document was prepared using Dragon voice recognition software and may include unintentional dictation errors.   Tommi Rumps, PA-C 03/03/22 1315    Chesley Noon, MD 03/04/22 [phone]

## 2022-03-03 NOTE — Discharge Instructions (Addendum)
Follow-up with your primary care provider if any continued problems.  Begin taking antibiotics until completely finished which is twice a day for the next 10 days.  Bromfed-DM as needed for cough and congestion.  You may take Tylenol or ibuprofen as needed for body aches, fever, headache.  Drink lots of fluids to stay hydrated.  Turn to the emergency department if any severe worsening of your symptoms such as shortness of breath or difficulty breathing.

## 2022-03-03 NOTE — ED Triage Notes (Signed)
Pt here with chills, cough, and body aches x5 days. Pt also c/o sore throat.

## 2022-03-03 NOTE — ED Notes (Addendum)
60 yom with a c/c of cough, congestion, and chills for the past five days. The pt stated he has been coughing up a dark greenish sputum.

## 2022-03-03 NOTE — ED Notes (Signed)
Pt A&O, pt given discharge instructions, pt ambulating with steady gait. 

## 2022-06-29 ENCOUNTER — Emergency Department
Admission: EM | Admit: 2022-06-29 | Discharge: 2022-06-30 | Disposition: A | Discharge status: Other Acute Inpatient Hospital | Payer: 59 | Attending: Emergency Medicine | Admitting: Emergency Medicine

## 2022-06-29 ENCOUNTER — Other Ambulatory Visit: Payer: Self-pay

## 2022-06-29 ENCOUNTER — Encounter: Payer: Self-pay | Admitting: Emergency Medicine

## 2022-06-29 DIAGNOSIS — R440 Auditory hallucinations: Secondary | ICD-10-CM

## 2022-06-29 DIAGNOSIS — R45851 Suicidal ideations: Secondary | ICD-10-CM

## 2022-06-29 DIAGNOSIS — F23 Brief psychotic disorder: Secondary | ICD-10-CM

## 2022-06-29 DIAGNOSIS — F29 Unspecified psychosis not due to a substance or known physiological condition: Secondary | ICD-10-CM | POA: Insufficient documentation

## 2022-06-29 DIAGNOSIS — E039 Hypothyroidism, unspecified: Secondary | ICD-10-CM | POA: Insufficient documentation

## 2022-06-29 DIAGNOSIS — F1410 Cocaine abuse, uncomplicated: Secondary | ICD-10-CM | POA: Diagnosis present

## 2022-06-29 DIAGNOSIS — Z1152 Encounter for screening for COVID-19: Secondary | ICD-10-CM | POA: Insufficient documentation

## 2022-06-29 LAB — COMPREHENSIVE METABOLIC PANEL
ALT: 25 U/L (ref 0–44)
AST: 36 U/L (ref 15–41)
Albumin: 3.7 g/dL (ref 3.5–5.0)
Alkaline Phosphatase: 59 U/L (ref 38–126)
Anion gap: 4 — ABNORMAL LOW (ref 5–15)
BUN: 11 mg/dL (ref 6–20)
CO2: 28 mmol/L (ref 22–32)
Calcium: 8.7 mg/dL — ABNORMAL LOW (ref 8.9–10.3)
Chloride: 107 mmol/L (ref 98–111)
Creatinine, Ser: 0.98 mg/dL (ref 0.61–1.24)
GFR, Estimated: >60 mL/min (ref >60)
Glucose, Bld: 95 mg/dL (ref 70–99)
Potassium: 3.8 mmol/L (ref 3.5–5.1)
Sodium: 139 mmol/L (ref 135–145)
Total Bilirubin: 1.1 mg/dL (ref 0.3–1.2)
Total Protein: 6.3 g/dL — ABNORMAL LOW (ref 6.5–8.1)

## 2022-06-29 LAB — CBC
HCT: 41 % (ref 39.0–52.0)
Hemoglobin: 13.4 g/dL (ref 13.0–17.0)
MCH: 31.4 pg (ref 26.0–34.0)
MCHC: 32.7 g/dL (ref 30.0–36.0)
MCV: 96 fL (ref 80.0–100.0)
Platelets: 234 10*3/uL (ref 150–400)
RBC: 4.27 MIL/uL (ref 4.22–5.81)
RDW: 14.5 % (ref 11.5–15.5)
WBC: 6.6 10*3/uL (ref 4.0–10.5)
nRBC: 0 % (ref 0.0–0.2)

## 2022-06-29 LAB — URINE DRUG SCREEN, QUALITATIVE (ARMC ONLY)
Amphetamines, Ur Screen: NOT DETECTED
Barbiturates, Ur Screen: NOT DETECTED
Benzodiazepine, Ur Scrn: NOT DETECTED
Cannabinoid 50 Ng, Ur ~~LOC~~: NOT DETECTED
Cocaine Metabolite,Ur ~~LOC~~: POSITIVE — AB
MDMA (Ecstasy)Ur Screen: NOT DETECTED
Methadone Scn, Ur: NOT DETECTED
Opiate, Ur Screen: NOT DETECTED
Phencyclidine (PCP) Ur S: NOT DETECTED
Tricyclic, Ur Screen: NOT DETECTED

## 2022-06-29 LAB — SARS CORONAVIRUS 2 BY RT PCR: SARS Coronavirus 2 by RT PCR: NEGATIVE

## 2022-06-29 LAB — ACETAMINOPHEN LEVEL: Acetaminophen (Tylenol), Serum: <10 ug/mL — ABNORMAL LOW (ref 10–30)

## 2022-06-29 LAB — SALICYLATE LEVEL: Salicylate Lvl: <7 mg/dL — ABNORMAL LOW (ref 7.0–30.0)

## 2022-06-29 LAB — ETHANOL: Alcohol, Ethyl (B): <10 mg/dL (ref <10)

## 2022-06-29 MED ORDER — BENZTROPINE MESYLATE 1 MG PO TABS
0.5000 mg | ORAL_TABLET | Freq: Every day | ORAL | Status: DC
  Administered 2022-06-29 – 2022-06-30 (×2): 0.5 mg via ORAL
  Filled 2022-06-29 (×2): qty 1

## 2022-06-29 MED ORDER — HALOPERIDOL 0.5 MG PO TABS
2.0000 mg | ORAL_TABLET | Freq: Two times a day (BID) | ORAL | Status: DC
  Administered 2022-06-29 – 2022-06-30 (×3): 2 mg via ORAL
  Filled 2022-06-29 (×3): qty 4

## 2022-06-29 MED ORDER — HYDROXYZINE HCL 25 MG PO TABS
25.0000 mg | ORAL_TABLET | Freq: Three times a day (TID) | ORAL | Status: DC | PRN
  Administered 2022-06-29: 25 mg via ORAL
  Filled 2022-06-29: qty 1

## 2022-06-29 NOTE — ED Notes (Signed)
Nurse Practitioner in room with Patient for assessment, Patient remains calm and cooperative, will continue to monitor.

## 2022-06-29 NOTE — ED Notes (Signed)
Lunch tray provided. 

## 2022-06-29 NOTE — Consult Note (Addendum)
[facility] Face-to-Face Psychiatry Consult   Reason for Consult:  auditory hallucinations Referring Physician:  Bradler Patient Identification: Jeremy [name] MRN:  [id number] Principal Diagnosis: <principal problem not specified> Diagnosis:  Active Problems:   * No active hospital problems. *   Total Time spent with patient: 45 minutes  Subjective:   Jeremy [name] is a 61 y.o. male patient admitted with hallucinations.  HPI:  Patient presents voluntarily to the ED, complaining of hallucinations and delusions. On evaluation, he reports that he has had hallucinations for a month, when he says he ran out of his haldol. He reports hearing voices that "antagonize me." He reports seeing shadows and feeling "ghost shadows" walking behind him and feels his shirt being pulled up. Paranoid that people are following him. He goes into the forest and he hears people blowing horns at him.  Patient has some superficial cuts on his arm and  states that he tried to kill himself when the voices got bad. Patient denies homicidal thoughts.  He is speaking in coherent sentences, cooperative. He admits to cocaine use about 3 days ago. Denies other illicit drug use. Patient would like to be admitted to get back on his medications and stabilize. Patient insistent that he has hallucinations even when he is not using cocaine.   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 No pertinent surgical history. Family History: No family history on file. Family Psychiatric  History:  Social History:  Social History   Substance and Sexual Activity  Alcohol Use Yes   Comment: per pt he drinks a pint daily     Social History    Substance and Sexual Activity  Drug Use Yes   Types: Cocaine    Social History   Socioeconomic History   Marital status: Single    Spouse name: Not on file   Number of children: Not on file   Years of education: Not on file   Highest education level: Not on file  Occupational History   Not on file  Tobacco Use   Smoking status: Never   Smokeless tobacco: Never  Vaping Use   Vaping Use: Never used  Substance and Sexual Activity   Alcohol use: Yes    Comment: per pt he drinks a pint daily   Drug use: Yes    Types: Cocaine   Sexual activity: Not Currently  Other Topics Concern   Not on file  Social History Narrative   Not on file   Social Determinants of Health   Financial Resource Strain: Not on file  Food Insecurity: Not on file  Transportation Needs: Not on file  Physical Activity: Not on file  Stress: Not on file  Social Connections: Not on file   Additional Social History:    Allergies:  No Known Allergies  Labs:  Results for orders placed or performed during the hospital encounter of 06/29/22 (from the past 48 hour(s))  Comprehensive metabolic panel     Status: Abnormal   Collection Time: 06/29/22  7:39 AM  Result Value Ref Range   Sodium 139 135 - 145 mmol/L   Potassium 3.8 3.5 - 5.1 mmol/L   Chloride 107 98 -  111 mmol/L   CO2 28 22 - 32 mmol/L   Glucose, Bld 95 70 - 99 mg/dL    Comment: Glucose reference range applies only to samples taken after fasting for at least 8 hours.   BUN 11 6 - 20 mg/dL   Creatinine, Ser 4.25 0.61 - 1.24 mg/dL   Calcium 8.7 (L) 8.9 - 10.3 mg/dL   Total Protein 6.3 (L) 6.5 - 8.1 g/dL   Albumin 3.7 3.5 - 5.0 g/dL   AST 36 15 - 41 U/L   ALT 25 0 - 44 U/L   Alkaline Phosphatase 59 38 - 126 U/L   Total Bilirubin 1.1 0.3 - 1.2 mg/dL   GFR, Estimated >95 >63 mL/min    Comment: (NOTE) Calculated using the CKD-EPI Creatinine Equation (2021)    Anion gap 4 (L) 5 - 15    Comment: Performed at [facility], [address] [place], [place] [address]  Ethanol     Status: None   Collection Time: 06/29/22  7:39 AM  Result Value Ref Range   Alcohol, Ethyl (B) <10 <10 mg/dL    Comment: (NOTE) Lowest detectable limit for serum alcohol is 10 mg/dL.  For medical purposes only. Performed at [facility], [address] [place], [place] [address]   Salicylate level     Status: Abnormal   Collection Time: 06/29/22  7:39 AM  Result Value Ref Range   Salicylate Lvl <7.0 (L) 7.0 - 30.0 mg/dL    Comment: Performed at [facility], [address] [place], [place] [address]  Acetaminophen level     Status: Abnormal   Collection Time: 06/29/22  7:39 AM  Result Value Ref Range   Acetaminophen (Tylenol), Serum <10 (L) 10 - 30 ug/mL    Comment: (NOTE) Therapeutic concentrations vary significantly. A range of 10-30 ug/mL  may be an effective concentration for many patients. However, some  are best treated at concentrations outside of this range. Acetaminophen concentrations >150 ug/mL at 4 hours after ingestion  and >50 ug/mL at 12 hours after ingestion are often associated with  toxic reactions.  Performed at [facility], [address] [place], [place] [address]   cbc     Status: None   Collection Time: 06/29/22  7:39 AM  Result Value Ref Range   WBC 6.6 4.0 - 10.5 K/uL   RBC 4.27 4.22 - 5.81 MIL/uL   Hemoglobin 13.4 13.0 - 17.0 g/dL   HCT 01.6 01.0 - 93.2 %   MCV 96.0 80.0 - 100.0 fL   MCH 31.4 26.0 - 34.0 pg   MCHC 32.7 30.0 - 36.0 g/dL   RDW 35.5 73.2 - 20.2 %   Platelets 234 150 - 400 K/uL   nRBC 0.0 0.0 - 0.2 %    Comment: Performed at [facility], [address], [place], [place] [address]  SARS Coronavirus 2 by RT PCR (hospital order, performed in [facility] hospital lab) *cepheid single result test* Anterior Nasal Swab     Status: None   Collection Time: 06/29/22 11:21 AM   Specimen: Anterior Nasal Swab  Result Value Ref Range   SARS  Coronavirus 2 by RT PCR NEGATIVE NEGATIVE    Comment: (NOTE) SARS-CoV-2 target nucleic acids are NOT DETECTED.  The SARS-CoV-2 RNA is generally detectable in upper and lower respiratory specimens during the acute phase of infection. The lowest concentration of SARS-CoV-2 viral copies this assay can detect is 250 copies / mL. A  negative result does not preclude SARS-CoV-2 infection and should not be used as the sole basis for treatment or other patient management decisions.  A negative result may occur with improper specimen collection / handling, submission of specimen other than nasopharyngeal swab, presence of viral mutation(s) within the areas targeted by this assay, and inadequate number of viral copies (<250 copies / mL). A negative result must be combined with clinical observations, patient history, and epidemiological information.  Fact Sheet for Patients:   https://www.patel.info/  Fact Sheet for Healthcare Providers: https://hall.com/  This test is not yet approved or  cleared by the [place] FDA and has been authorized for detection and/or diagnosis of SARS-CoV-2 by FDA under an Emergency Use Authorization (EUA).  This EUA will remain in effect (meaning this test can be used) for the duration of the COVID-19 declaration under Section 564(b)(1) of the Act, 21 U.S.C. section 360bbb-3(b)(1), unless the authorization is terminated or revoked sooner.  Performed at [facility], [address] [place], [place] [address]     Current Facility-Administered Medications  Medication Dose Route Frequency Provider Last Rate Last Admin   benztropine (COGENTIN) tablet 0.5 mg  0.5 mg Oral Daily Waldon Merl F, NP   0.5 mg at 06/29/22 1248   haloperidol (HALDOL) tablet 2 mg  2 mg Oral BID Waldon Merl F, NP   2 mg at 06/29/22 1249   Current Outpatient Medications  Medication Sig Dispense Refill   benztropine  (COGENTIN) 0.5 MG tablet Take 1 tablet (0.5 mg total) by mouth daily. 30 tablet 2   brompheniramine-pseudoephedrine-DM 30-2-10 MG/5ML syrup Take 5 mLs by mouth 4 (four) times daily as needed. 120 mL 0   doxycycline (VIBRAMYCIN) 100 MG capsule Take 1 capsule (100 mg total) by mouth 2 (two) times daily. 20 capsule 0   haloperidol (HALDOL) 2 MG tablet Take 1 tablet (2 mg total) by mouth 2 (two) times daily. 60 tablet 2    Musculoskeletal: Strength & Muscle Tone: within normal limits Gait & Station: normal Patient leans: N/A   Psychiatric Specialty Exam:  Presentation  General Appearance:  Appropriate for Environment  Eye Contact: Fair  Speech: Clear and Coherent  Speech Volume: Normal  Handedness:No data recorded  Mood and Affect  Mood: Depressed  Affect: Congruent   Thought Process  Thought Processes: Coherent  Descriptions of Associations:Intact  Orientation:Full (Time, Place and Person)  Thought Content:WDL  History of Schizophrenia/Schizoaffective disorder:No  Duration of Psychotic Symptoms:N/A  Hallucinations:Hallucinations: Auditory Description of Auditory Hallucinations: Not currently, but "when I am alone-negative voices"  Ideas of Reference:Paranoia; Delusions  Suicidal Thoughts:Suicidal Thoughts: Yes, Passive  Homicidal Thoughts:Homicidal Thoughts: No   Sensorium  Memory: Immediate Fair  Judgment: Fair  Insight:Poor   Executive Functions  Concentration: Poor  Attention Span: Fair  Recall: [employer] of Knowledge: Fair  Language: Fair   Psychomotor Activity  Psychomotor Activity:Psychomotor Activity: Normal   Assets  Assets: Physical Health; Resilience; Financial Resources/Insurance; Desire for Improvement   Sleep  Sleep:Sleep: Poor   Physical Exam: Physical Exam Vitals and nursing note reviewed.  HENT:     Head: Normocephalic.     Nose: No congestion or rhinorrhea.  Eyes:     General:        Right eye:  No discharge.        Left eye: No discharge.  Cardiovascular:     Rate and Rhythm: Normal rate.  Pulmonary:     Effort: Pulmonary effort is normal.  Musculoskeletal:  General: Normal range of motion.     Cervical back: Normal range of motion.  Skin:    General: Skin is dry.  Neurological:     Mental Status: He is alert and oriented to person, place, and time.  Psychiatric:        Attention and Perception: Attention normal.        Mood and Affect: Affect is blunt.        Speech: Speech normal.        Behavior: Behavior is cooperative.        Thought Content: Thought content is paranoid and delusional. Thought content includes suicidal ideation.        Cognition and Memory: Cognition normal.        Judgment: Judgment is impulsive.    Review of Systems  Constitutional: Negative.   Respiratory: Negative.    Musculoskeletal: Negative.   Skin: Negative.   Psychiatric/Behavioral:  Positive for depression, hallucinations, substance abuse and suicidal ideas. Negative for memory loss. The patient is not nervous/anxious and does not have insomnia.    Blood pressure (!) 131/97, pulse 61, temperature 97.6 F (36.4 C), temperature source Oral, resp. rate 16, height 5\' 9"  (1.753 m), weight 99.8 kg, SpO2 100 %. Body mass index is 32.49 kg/m.  Treatment Plan Summary: Daily contact with patient to assess and evaluate symptoms and progress in treatment and Medication management. Re-started haldol 2 mg daily for hallucinations and cogentin 0.5 mg daily for EPS, prophylaxis. Will refer for hospitalization.. Reviewed with EDP  Disposition: Recommend psychiatric Inpatient admission when medically cleared.  , NP 06/29/2022 1:43 PM

## 2022-06-29 NOTE — ED Triage Notes (Signed)
Placed in the middle of triage for safety.

## 2022-06-29 NOTE — ED Notes (Signed)
VOL  CONSULT  DONE  PENDING  PLACEMENT 

## 2022-06-29 NOTE — ED Notes (Signed)
Patient transferred from triage to room 1, oriented to unit, no behavioral issues, He is calm and cooperative and will continue to monitor.

## 2022-06-29 NOTE — ED Notes (Signed)
Patient resting, no behavioral issues noted, will continue to monitor, Nurse spoke to Patient and He contracts for safety here, states that he is having Si thoughts, but no plan.

## 2022-06-29 NOTE — ED Triage Notes (Signed)
C/O hearing voices and states at night he feels something come up from behind him and lift up his shirt.  Also sees people watching him and following him.  Also c/o difficulty sleeping.  States "I can't be around people.  I think my mother is poisoning me." Also states he has been out of medications x 3 weeks.  Cogentin and Hyberdal.  Also c/o SI x 1 week.  States has been cutting self at times.  Awake, alert, anxious, cooperative.

## 2022-06-29 NOTE — ED Notes (Addendum)
Patient belongings:  Pair of sneakers Pair of socks Pair of jean shorts Long sleeve shirt Black watch Dark blue hoodie zip up  [employer] baseball cap Cell phone Madison Pray

## 2022-06-29 NOTE — ED Notes (Signed)
Alert and Oriented Presents with disorganized thought process and preoccupied content. Patient endorses Passive SI and AH telling him he is not worth anything, Denies HI and verbally contracted for safety.   Scheduled medications administered per Provider order. Support and encouragement provided. Routine safety checks conducted every 15 minutes. Patient notified to inform staff with problems or concerns.  No adverse drug reactions noted.

## 2022-06-29 NOTE — BH Assessment (Signed)
Patient is under review with [facility] for tomm's admission 06/30/22.

## 2022-06-29 NOTE — ED Provider Notes (Signed)
[facility] Provider Note   Event Date/Time   First MD Initiated Contact with Patient 06/29/22 1112     (approximate) History  No chief complaint on file.  HPI Jeremy [name] is a 61 y.o. male with a stated past medical history of schizophrenia who presents for hearing voices as well as performing cutting behavior with suicidal ideation to cut his wrists.  Patient also states that he has been having difficulty sleeping due to being out of his medications for the past 3 weeks.  Patient states that voices are also telling him to hurt himself and that his mother has been poisoning him. ROS: Patient currently denies any vision changes, tinnitus, difficulty speaking, facial droop, sore throat, chest pain, shortness of breath, abdominal pain, nausea/vomiting/diarrhea, dysuria, or weakness/numbness/paresthesias in any extremity   Physical Exam  Triage Vital Signs: ED Triage Vitals  Enc Vitals Group     BP 06/29/22 0737 (!) 131/97     Pulse Rate 06/29/22 0737 61     Resp 06/29/22 0737 16     Temp 06/29/22 0737 97.6 F (36.4 C)     Temp Source 06/29/22 0737 Oral     SpO2 06/29/22 0737 100 %     Weight 06/29/22 0735 220 lb 0.3 oz (99.8 kg)     Height 06/29/22 0735 5\' 9"  (1.753 m)     Head Circumference --      Peak Flow --      Pain Score 06/29/22 0735 0     Pain Loc --      Pain Edu? --      Excl. in [place]? --    Most recent vital signs: Vitals:   06/29/22 0737  BP: (!) 131/97  Pulse: 61  Resp: 16  Temp: 97.6 F (36.4 C)  SpO2: 100%   General: Awake, oriented x4. CV:  Good peripheral perfusion. Resp:  Normal effort. Abd:  No distention. Other:  Middle-aged African-American male sitting in chair in triage in no acute distress ED Results / Procedures / Treatments  Labs (all labs ordered are listed, but only abnormal results are displayed) Labs Reviewed  COMPREHENSIVE METABOLIC PANEL - Abnormal; Notable for the following components:      Result Value    Calcium 8.7 (*)    Total Protein 6.3 (*)    Anion gap 4 (*)    All other components within normal limits  SALICYLATE LEVEL - Abnormal; Notable for the following components:   Salicylate Lvl <1.7 (*)    All other components within normal limits  ACETAMINOPHEN LEVEL - Abnormal; Notable for the following components:   Acetaminophen (Tylenol), Serum <10 (*)    All other components within normal limits  SARS CORONAVIRUS 2 BY RT PCR  RESP PANEL BY RT-PCR (FLU A&B, COVID) ARPGX2  ETHANOL  CBC  URINE DRUG SCREEN, QUALITATIVE ([facility] ONLY)  PROCEDURES: Critical Care performed: No Procedures MEDICATIONS ORDERED IN ED: Medications  haloperidol (HALDOL) tablet 2 mg (2 mg Oral Given 06/29/22 1249)  benztropine (COGENTIN) tablet 0.5 mg (0.5 mg Oral Given 06/29/22 1248)   IMPRESSION / MDM / ASSESSMENT AND PLAN / ED COURSE  I reviewed the triage vital signs and the nursing notes.                             The patient is on the cardiac monitor to evaluate for evidence of arrhythmia and/or significant heart rate changes. Patient's presentation  is most consistent with acute presentation with potential threat to life or bodily function. Patient presents under IVC for hallucinations/delusions. Thoughts are disorganized. No history of prior suicide attempt, and no SI or HI at this time. Clinically w/ no overt toxidrome, low suspicion for ingestion given hx and exam Thoughts unlikely 2/2 anemia, hypothyroidism, infection, or ICH. Patients decision making capacity is compromised and they are unable to perform all ADLs (additionally they are without appropriate caretakers to assist through this deficit).  Consult: Psychiatry to evaluate patient for grave disability Disposition: Pending psychiatric placement  Care of this patient will be signed out the oncoming physician.  All pertinent patient formation is conveyed and all questions answered.  All further care and disposition decisions will be made  by the oncoming physician.   FINAL CLINICAL IMPRESSION(S) / ED DIAGNOSES   Final diagnoses:  Acute psychosis (HCC)  Suicidal ideation   Rx / DC Orders   ED Discharge Orders     None      Note:  This document was prepared using Dragon voice recognition software and may include unintentional dictation errors.   Merwyn Katos, MD 06/29/22 1520

## 2022-06-29 NOTE — BH Assessment (Addendum)
Comprehensive Clinical Assessment (CCA) Note  06/29/2022 Jeremy [name] [id number]  Jeremy [name], 61 year old male who presents to [facility] ED voluntarily for treatment. Per triage note, C/O hearing voices and states at night he feels something come up from behind him and lift up his shirt.  Also sees people watching him and following him.  Also c/o difficulty sleeping.  States "I can't be around people.  I think my mother is poisoning me." Also states he has been out of medications x 3 weeks.  Cogentin and Hyberdal. Also c/o SI x 1 week.  States has been cutting self at times. Awake, alert, anxious, cooperative.   During TTS assessment pt presents alert and oriented x 4, restless but cooperative, and mood-congruent with affect. The pt does not appear to be responding to internal or external stimuli. Neither is the pt presenting with any delusional thinking. Pt verified the information provided to triage RN.   Pt identifies his main complaint to be that he is having suicidal ideations. Patient reports he has been off medications for about a month but also using cocaine and drinking alcohol. Patient admits to recent drug use about 2 days ago. Patient states he is hearing voices "that antagonize" him when he is alone. Patient reports seeing shadows and feeling "ghosts" that pull his shirt up. Patient states he is paranoid being around people and believes they are out to get him. Patient reports prior attempt to kill himself about 3 weeks ago by cutting his wrist. Patient believes inpatient treatment would be beneficial at this time.    Per Sallye Ober, NP pt is recommended for inpatient admission.    Chief Complaint: Suicidal ideations Visit Diagnosis: Auditory hallucinations    CCA Screening, Triage and Referral (STR)  Patient Reported Information How did you hear about [place]? Self  Referral name: No data recorded Referral phone number: No data recorded  Whom do you see for routine medical  problems? No data recorded Practice/Facility Name: No data recorded Practice/Facility Phone Number: No data recorded Name of Contact: No data recorded Contact Number: No data recorded Contact Fax Number: No data recorded Prescriber Name: No data recorded Prescriber Address (if known): No data recorded  What Is the Reason for Your Visit/Call Today? Patient having SI, auditory and visual hallucinations and paranoia.  How Long Has This Been Causing You Problems? 1 wk - 1 month  What Do You Feel Would Help You the Most Today? Medication(s)   Have You Recently Been in Any Inpatient Treatment (Hospital/Detox/Crisis Center/28-Day Program)? No data recorded Name/Location of Program/Hospital:No data recorded How Long Were You There? No data recorded When Were You Discharged? No data recorded  Have You Ever Received Services From [facility] Before? No data recorded Who Do You See at [facility]? No data recorded  Have You Recently Had Any Thoughts About Hurting Yourself? Yes  Are You Planning to Commit Suicide/Harm Yourself At This time? No   Have you Recently Had Thoughts About Hurting Someone [name]? No  Explanation: No data recorded  Have You Used Any Alcohol or Drugs in the Past 24 Hours? No  How Long Ago Did You Use Drugs or Alcohol? No data recorded What Did You Use and How Much? Unknown amout at 8 PM on 09/21/21   Do You Currently Have a Therapist/Psychiatrist? No  Name of Therapist/Psychiatrist: No data recorded  Have You Been Recently Discharged From Any Office Practice or Programs? No  Explanation of Discharge From Practice/Program: No data recorded  CCA Screening Triage Referral Assessment Type of Contact: Face-to-Face  Is this Initial or Reassessment? No data recorded Date Telepsych consult ordered in [facility]:  No data recorded Time Telepsych consult ordered in [facility]:  No data recorded  Patient Reported Information Reviewed? No data recorded Patient Left Without  Being Seen? No data recorded Reason for Not Completing Assessment: No data recorded  Collateral Involvement: None provided   Does Patient Have a Court Appointed Legal Guardian? No data recorded Name and Contact of Legal Guardian: No data recorded If Minor and Not Living with Parent(s), Who has Custody? n/a  Is CPS involved or ever been involved? Never  Is APS involved or ever been involved? Never   Patient Determined To Be At Risk for Harm To Self or Others Based on Review of Patient Reported Information or Presenting Complaint? Yes, for Self-Harm  Method: No Plan  Availability of Means: No access or NA  Intent: Vague intent or NA  Notification Required: No need or identified person  Additional Information for Danger to Others Potential: Active psychosis  Additional Comments for Danger to Others Potential: n/a  Are There Guns or Other Weapons in Your Home? No  Types of Guns/Weapons: No data recorded Are These Weapons Safely Secured?                            No data recorded Who Could Verify You Are Able To Have These Secured: No data recorded Do You Have any Outstanding Charges, Pending Court Dates, Parole/Probation? n/a  Contacted To Inform of Risk of Harm To Self or Others: No data recorded  Location of Assessment: [facility] ED   Does Patient Present under Involuntary Commitment? No  IVC Papers Initial File Date:  (n/a)   [place] of Residence: [place]   Patient Currently Receiving the Following Services: Not Receiving Services   Determination of Need: Emergent (2 hours)   Options For Referral: ED Visit; Medication Management; Inpatient Hospitalization; Outpatient Therapy     CCA Biopsychosocial Intake/Chief Complaint:  No data recorded Current Symptoms/Problems: No data recorded  Patient Reported Schizophrenia/Schizoaffective Diagnosis in Past: No   Strengths: Patient is able to verbalize and communicate needs.  Preferences: No data  recorded Abilities: No data recorded  Type of Services Patient Feels are Needed: No data recorded  Initial Clinical Notes/Concerns: No data recorded  Mental Health Symptoms Depression:   None   Duration of Depressive symptoms: No data recorded  Mania:   N/A   Anxiety:    Worrying; Tension   Psychosis:   Delusions; Hallucinations   Duration of Psychotic symptoms:  Less than six months   Trauma:   N/A   Obsessions:   Recurrent & persistent thoughts/impulses/images; Cause anxiety; Intrusive/time consuming   Compulsions:   Repeated behaviors/mental acts; "Driven" to perform behaviors/acts; Good insight   Inattention:   None   Hyperactivity/Impulsivity:   None   Oppositional/Defiant Behaviors:   N/A   Emotional Irregularity:   Potentially harmful impulsivity   Other Mood/Personality Symptoms:  No data recorded   Mental Status Exam Appearance and self-care  Stature:   Average   Weight:   Average weight   Clothing:   Casual   Grooming:   Normal   Cosmetic use:   None   Posture/gait:   Tense   Motor activity:   Not Remarkable   Sensorium  Attention:   Normal   Concentration:   Normal   Orientation:   X5  Recall/memory:   Normal   Affect and Mood  Affect:   Appropriate   Mood:   Dysphoric   Relating  Eye contact:   Avoided   Facial expression:   Sad   Attitude toward examiner:   Cooperative   Thought and Language  Speech flow:  Clear and Coherent   Thought content:   Appropriate to Mood and Circumstances   Preoccupation:   None   Hallucinations:   Auditory; Visual   Organization:  No data recorded  [employer] of Knowledge:   Average   Intelligence:   Average   Abstraction:   Normal   Judgement:   Impaired   Reality Testing:   Realistic   Insight:   Good   Decision Making:   Impulsive   Social Functioning  Social Maturity:   Isolates   Social Judgement:   "Street Smart"    Stress  Stressors:   Family conflict   Coping Ability:   Exhausted   Skill Deficits:   Environmental health practitioner; Self-care; Responsibility   Supports:   Support needed     Religion:    Leisure/Recreation:    Exercise/Diet: Exercise/Diet Do You Have Any Trouble Sleeping?: Yes Explanation of Sleeping Difficulties: Pt reported that he is up all night.   CCA Employment/Education Employment/Work Situation: Employment / Work Technical sales engineer: Unemployed  Education: Education Is Patient Currently Attending School?: No   CCA Family/Childhood History Family and Relationship History: Family history Marital status: Single  Childhood History:  Childhood History By whom was/is the patient raised?: Mother  Child/Adolescent Assessment:     CCA Substance Use Alcohol/Drug Use: Alcohol / Drug Use Pain Medications: See MAR Prescriptions: See MAR Over the Counter: See MAR History of alcohol / drug use?: Yes Longest period of sobriety (when/how long): 4 years Negative Consequences of Use: Financial, Personal relationships, Work / School Substance #1 Name of Substance 1: Cocaine                       ASAM's:  Six Dimensions of Multidimensional Assessment  Dimension 1:  Acute Intoxication and/or Withdrawal Potential:   Dimension 1:  Description of individual's past and current experiences of substance use and withdrawal: Pt has a hx of chronic cocaine and alcohol abuse  Dimension 2:  Biomedical Conditions and Complications:      Dimension 3:  Emotional, Behavioral, or Cognitive Conditions and Complications:  Dimension 3:  Description of emotional, behavioral, or cognitive conditions and complications: Pt has a hx of substance induced mood disorder and depression  Dimension 4:  Readiness to Change:     Dimension 5:  Relapse, Continued use, or Continued Problem Potential:     Dimension 6:  Recovery/Living Environment:     ASAM Severity Score: ASAM's  Severity Rating Score: 13  ASAM Recommended Level of Treatment: ASAM Recommended Level of Treatment: Level III Residential Treatment   Substance use Disorder (SUD) Substance Use Disorder (SUD)  Checklist Symptoms of Substance Use: Continued use despite having a persistent/recurrent physical/psychological problem caused/exacerbated by use, Continued use despite persistent or recurrent social, interpersonal problems, caused or exacerbated by use, Recurrent use that results in a failure to fulfill major role obligations (work, school, home), Social, occupational, recreational activities given up or reduced due to use  Recommendations for Services/Supports/Treatments: Recommendations for Services/Supports/Treatments Recommendations For Services/Supports/Treatments: Inpatient Hospitalization  DSM5 Diagnoses: Patient Active Problem List   Diagnosis Date Noted   Auditory hallucinations 06/29/2022   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

## 2022-06-30 ENCOUNTER — Inpatient Hospital Stay
Admission: AD | Admit: 2022-06-30 | Discharge: 2022-07-05 | DRG: 897 | Disposition: A | Discharge status: Home / Self Care | Payer: 59 | Source: Intra-hospital | Attending: Psychiatry | Admitting: Psychiatry

## 2022-06-30 ENCOUNTER — Other Ambulatory Visit: Payer: Self-pay

## 2022-06-30 ENCOUNTER — Encounter: Payer: Self-pay | Admitting: Psychiatry

## 2022-06-30 DIAGNOSIS — Z608 Other problems related to social environment: Secondary | ICD-10-CM | POA: Diagnosis present

## 2022-06-30 DIAGNOSIS — F32A Depression, unspecified: Secondary | ICD-10-CM | POA: Diagnosis present

## 2022-06-30 DIAGNOSIS — Z79899 Other long term (current) drug therapy: Secondary | ICD-10-CM

## 2022-06-30 DIAGNOSIS — R45851 Suicidal ideations: Secondary | ICD-10-CM | POA: Diagnosis present

## 2022-06-30 DIAGNOSIS — F14151 Cocaine abuse with cocaine-induced psychotic disorder with hallucinations: Principal | ICD-10-CM | POA: Diagnosis present

## 2022-06-30 DIAGNOSIS — F1410 Cocaine abuse, uncomplicated: Secondary | ICD-10-CM | POA: Diagnosis present

## 2022-06-30 DIAGNOSIS — Z5900 Homelessness unspecified: Secondary | ICD-10-CM

## 2022-06-30 DIAGNOSIS — F14951 Cocaine use, unspecified with cocaine-induced psychotic disorder with hallucinations: Secondary | ICD-10-CM | POA: Diagnosis present

## 2022-06-30 DIAGNOSIS — F19959 Other psychoactive substance use, unspecified with psychoactive substance-induced psychotic disorder, unspecified: Principal | ICD-10-CM | POA: Diagnosis present

## 2022-06-30 LAB — RESP PANEL BY RT-PCR (FLU A&B, COVID) ARPGX2
Influenza A by PCR: NEGATIVE
Influenza B by PCR: NEGATIVE
SARS Coronavirus 2 by RT PCR: NEGATIVE

## 2022-06-30 MED ORDER — ALUM & MAG HYDROXIDE-SIMETH 200-200-20 MG/5ML PO SUSP: 30.0000 mL | ORAL | Status: DC | PRN

## 2022-06-30 MED ORDER — BENZTROPINE MESYLATE 1 MG PO TABS
0.5000 mg | ORAL_TABLET | Freq: Every day | ORAL | Status: DC
  Administered 2022-07-01 – 2022-07-05 (×5): 0.5 mg via ORAL
  Filled 2022-06-30 (×5): qty 1

## 2022-06-30 MED ORDER — ACETAMINOPHEN 325 MG PO TABS: 650.0000 mg | ORAL_TABLET | Freq: Four times a day (QID) | ORAL | Status: DC | PRN

## 2022-06-30 MED ORDER — HYDROXYZINE HCL 25 MG PO TABS
25.0000 mg | ORAL_TABLET | Freq: Three times a day (TID) | ORAL | Status: DC | PRN
  Administered 2022-06-30 – 2022-07-02 (×2): 25 mg via ORAL
  Filled 2022-06-30 (×3): qty 1

## 2022-06-30 MED ORDER — MAGNESIUM HYDROXIDE 400 MG/5ML PO SUSP: 30.0000 mL | Freq: Every day | ORAL | Status: DC | PRN

## 2022-06-30 MED ORDER — HALOPERIDOL 1 MG PO TABS
2.0000 mg | ORAL_TABLET | Freq: Two times a day (BID) | ORAL | Status: DC
  Administered 2022-06-30 – 2022-07-02 (×4): 2 mg via ORAL
  Filled 2022-06-30 (×4): qty 2

## 2022-06-30 NOTE — Group Note (Signed)
[facility] LCSW Group Therapy Note   Group Date: 06/30/2022 Start Time: 1315 End Time: 1415   Type of Therapy/Topic:  Group Therapy:  Emotion Regulation  Participation Level:  Did Not Attend    Description of Group:    The purpose of this group is to assist patients in learning to regulate negative emotions and experience positive emotions. Patients will be guided to discuss ways in which they have been vulnerable to their negative emotions. These vulnerabilities will be juxtaposed with experiences of positive emotions or situations, and patients challenged to use positive emotions to combat negative ones. Special emphasis will be placed on coping with negative emotions in conflict situations, and patients will process healthy conflict resolution skills.  Therapeutic Goals: Patient will identify two positive emotions or experiences to reflect on in order to balance out negative emotions:  Patient will label two or more emotions that they find the most difficult to experience:  Patient will be able to demonstrate positive conflict resolution skills through discussion or role plays:   Summary of Patient Progress: X   Therapeutic Modalities:   Cognitive Behavioral Therapy Feelings Identification Dialectical Behavioral Therapy   Jeremy [name] R Nishat Livingston, LCSW 

## 2022-06-30 NOTE — Plan of Care (Signed)
  Problem: Education: Goal: Knowledge of General Education information will improve Description: Including pain rating scale, medication(s)/side effects and non-pharmacologic comfort measures Outcome: Not Progressing   Problem: Health Behavior/Discharge Planning: Goal: Ability to manage health-related needs will improve Outcome: Not Progressing   Problem: Clinical Measurements: Goal: Ability to maintain clinical measurements within normal limits will improve Outcome: Not Progressing Goal: Will remain free from infection Outcome: Not Progressing Goal: Diagnostic test results will improve Outcome: Not Progressing Goal: Respiratory complications will improve Outcome: Not Progressing Goal: Cardiovascular complication will be avoided Outcome: Not Progressing   Problem: Self-Concept: Goal: Will verbalize positive feelings about self Outcome: Not Progressing   Problem: Safety: Goal: Ability to redirect hostility and anger into socially appropriate behaviors will improve Outcome: Not Progressing Goal: Ability to remain free from injury will improve Outcome: Not Progressing   Problem: Role Relationship: Goal: Ability to communicate needs accurately will improve Outcome: Not Progressing Goal: Ability to interact with others will improve Outcome: Not Progressing

## 2022-06-30 NOTE — ED Provider Notes (Signed)
Emergency Medicine Observation Re-evaluation Note  Jeremy [name] is a 61 y.o. male, seen on rounds today.  Pt initially presented to the ED for complaints of No chief complaint on file.  Currently, the patient is calm, no acute complaints.  Physical Exam  Blood pressure (!) 113/48, pulse (!) 54, temperature 97.9 F (36.6 C), resp. rate 16, height 5\' 9"  (1.753 m), weight 99.8 kg, SpO2 96 %. Physical Exam General: NAD Lungs: CTAB Psych: not agitated  ED Course / MDM  EKG:    I have reviewed the labs performed to date as well as medications administered while in observation.  Recent changes in the last 24 hours include no acute events overnight.    Plan  Current plan is for psych admission. Patient is not under full IVC at this time.   Carrie Mew, MD 06/30/22 0800

## 2022-06-30 NOTE — Progress Notes (Signed)
Patient admitted from ED with psychoactive substance induced psychosis. Patient positive for cocaine.Patient not willing to answer the questions for admission assessment. Patient states " Its in the chart. I am tired of this." Patient states that he does not like to be around people. Patient verbalized passive SI with no plans. Denies HI and AVH at this time.Stated that the voices are bad when he is alone. Patient refuse to orient to unit. States " I have been here before." Skin assessment and body search done with [employer]. No contraband found. Made patient comfortable in the room. Support and encouragement given.

## 2022-06-30 NOTE — Tx Team (Signed)
Initial Treatment Plan 06/30/2022 5:24 PM Jeremy [name] [id number]    PATIENT STRESSORS: Financial difficulties   Medication change or noncompliance   Substance abuse     PATIENT STRENGTHS: Ability for insight  Average or above average intelligence  Communication skills    PATIENT IDENTIFIED PROBLEMS: Non compliant with medications  Substance abuse  Depression                 DISCHARGE CRITERIA:  Medical problems require only outpatient monitoring Verbal commitment to aftercare and medication compliance  PRELIMINARY DISCHARGE PLAN: Attend aftercare/continuing care group Return to previous living arrangement  PATIENT/FAMILY INVOLVEMENT: This treatment plan has been presented to and reviewed with the patient, Jeremy [name], and/or family member,  The patient and family have been given the opportunity to ask questions and make suggestions.  Merlene Morse, RN 06/30/2022, 5:24 PM

## 2022-06-30 NOTE — BH Assessment (Signed)
Patient is to be admitted to [facility] BMU today 06/30/22 before 12:00pm by Dr. Weber Cooks.  Attending Physician will be Dr.  Weber Cooks .   Patient has been assigned to room 303, by Hendry, Junita Push.   Intake Paper Work has been signed and placed on patient chart.  ER staff is aware of the admission: Luann, ER Secretary   Dr. Joni Fears, ER MD  Baxter Flattery, Patient's Nurse  Seth Bake, Patient Access.

## 2022-06-30 NOTE — ED Notes (Signed)
VOL/pending placement 

## 2022-07-01 DIAGNOSIS — F19959 Other psychoactive substance use, unspecified with psychoactive substance-induced psychotic disorder, unspecified: Secondary | ICD-10-CM

## 2022-07-01 LAB — HEMOGLOBIN A1C
Hgb A1c MFr Bld: 5.6 % (ref 4.8–5.6)
Mean Plasma Glucose: 114.02 mg/dL

## 2022-07-01 LAB — LIPID PANEL
Cholesterol: 191 mg/dL (ref 0–200)
HDL: 49 mg/dL (ref >40)
LDL Cholesterol: 126 mg/dL — ABNORMAL HIGH (ref 0–99)
Total CHOL/HDL Ratio: 3.9 RATIO
Triglycerides: 81 mg/dL (ref <150)
VLDL: 16 mg/dL (ref 0–40)

## 2022-07-01 NOTE — Group Note (Signed)
[facility] LCSW Group Therapy Note   Group Date: 07/01/2022 Start Time: 1300 End Time: 1400   Type of Therapy/Topic:  Group Therapy:  Emotion Regulation  Participation Level:  Did Not Attend   Mood:  Description of Group:    The purpose of this group is to assist patients in learning to regulate negative emotions and experience positive emotions. Patients will be guided to discuss ways in which they have been vulnerable to their negative emotions. These vulnerabilities will be juxtaposed with experiences of positive emotions or situations, and patients challenged to use positive emotions to combat negative ones. Special emphasis will be placed on coping with negative emotions in conflict situations, and patients will process healthy conflict resolution skills.  Therapeutic Goals: Patient will identify two positive emotions or experiences to reflect on in order to balance out negative emotions:  Patient will label two or more emotions that they find the most difficult to experience:  Patient will be able to demonstrate positive conflict resolution skills through discussion or role plays:   Summary of Patient Progress: Patient did not attend group despite encouraged participation.      Therapeutic Modalities:   Cognitive Behavioral Therapy Feelings Identification Dialectical Behavioral Therapy   Makenzee Choudhry W Detric Scalisi, LCSWA 

## 2022-07-01 NOTE — [facility] Suicide Risk Assessment (Signed)
[place] INPATIENT:  Family/Significant Other Suicide Prevention Education  Suicide Prevention Education:  Patient Refusal for Family/Significant Other Suicide Prevention Education: The patient Jeremy [name] has refused to provide written consent for family/significant other to be provided Family/Significant Other Suicide Prevention Education during admission and/or prior to discharge.  Physician notified.  CSW completed SPE with patient. Discussed potential triggers leading to suicidal ideation in addition to coping skills one might use in order to delay and distract self from self harming behaviors. CSW encouraged patient to utilize emergency services if they felt unable to maintain their safety. SPE flyer provided to patient at this time.   Durenda Hurt 07/01/2022, 10:39 AM

## 2022-07-01 NOTE — H&P (Signed)
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  61 year old man with a history of substance abuse and psychotic symptoms and mood instability.  Patient presented to the emergency room complaining of hallucinations and suicidal thoughts.  He states that he has been homeless for the most part staying occasionally at family houses.  Admits to being back to using crack cocaine.  Has been off his medicine for what he estimates as being a couple of months.  He has no intention of planning to kill himself but feels very sad and down and has passive suicidal thoughts.  He was having hallucinations which have now diminished greatly. Associated Signs/Symptoms: Depression Symptoms:  depressed mood, fatigue, suicidal thoughts without plan, Duration of Depression Symptoms: No data recorded (Hypo) Manic Symptoms:  Impulsivity, Anxiety Symptoms:  Excessive Worry, Psychotic Symptoms:  Hallucinations: Auditory PTSD Symptoms: Negative Total Time spent with patient: 45 minutes  Past Psychiatric History: Patient has a long history of similar presentations.  Substance abuse is probably seen as the primary condition although he also has underlying mood and psychotic symptoms that are usually manifest under a great deal of stress.  Has benefited at least temporarily from medication although a continued sobriety has been elusive and would be the most helpful thing.  Has done some cutting in the past but no more serious suicide attempts than that.  Is  the patient at risk to self? Yes.    Has the patient been a risk to self in the past 6 months? Yes.    Has the patient been a risk to self within the distant past? Yes.    Is the patient a risk to others? No.  Has the patient been a risk to others in the past 6 months? No.  Has the patient been a risk to others within the distant past? No.   [place] Scale:  Bond Admission (Current) from 06/30/2022 in [facility] from 06/29/2022 in [facility] from 03/03/2022 in [facility] RISK CATEGORY Low Risk No Risk No Risk        Prior Inpatient Therapy:   Prior Outpatient Therapy:    Alcohol Screening: 1. How often do you have a drink containing alcohol?: 2 to 3 times a week 2. How many drinks containing alcohol do you have on a typical day when you are drinking?: 1 or 2 3. How often do you have six or more drinks on one occasion?: Never AUDIT-C Score: 3 4. How often during the last year have you found that you were not able to stop drinking once you had started?: Never 5. How often during the last year have you failed to do what was normally expected from you because of drinking?: Never 6. How often during the last year have you needed a first drink in the morning to get yourself going after a heavy drinking session?: Never 7. How often during the last year have you had a feeling of guilt of remorse after drinking?: Never 8. How often during the last year have you been unable to remember what happened the night  before because you had been drinking?: Never 9. Have you or someone else been injured as a result of your drinking?: No 10. Has a relative or friend or a doctor or another health worker been concerned about your drinking or suggested you cut down?: No Alcohol Use Disorder Identification Test Final Score (AUDIT): 3 Alcohol Brief Interventions/Follow-up: Patient  Refused Substance Abuse History in the last 12 months:  Yes.   Consequences of Substance Abuse: Substance use problems are seen as probably being the primary driver of the worst of his symptoms and homelessness. Previous Psychotropic Medications: Yes  Psychological Evaluations: Yes  Past Medical History:  Past Medical History:  Diagnosis Date   Schizophrenia (HCC)    History reviewed. No pertinent surgical history. Family History: History reviewed. No pertinent family history. Family Psychiatric  History: None reported Tobacco Screening: Have you used any form of tobacco in the last 30 days? (Cigarettes, Smokeless Tobacco, Cigars, and/or Pipes): No Social History:  Social History   Substance and Sexual Activity  Alcohol Use Yes   Comment: per pt he drinks a pint daily     Social History   Substance and Sexual Activity  Drug Use Yes   Types: Cocaine    Additional Social History:                           Allergies:  No Known Allergies Lab Results:  Results for orders placed or performed during the hospital encounter of 06/29/22 (from the past 48 hour(s))  Urine Drug Screen, Qualitative     Status: Abnormal   Collection Time: 06/29/22  4:00 PM  Result Value Ref Range   Tricyclic, Ur Screen NONE DETECTED NONE DETECTED   Amphetamines, Ur Screen NONE DETECTED NONE DETECTED   MDMA (Ecstasy)Ur Screen NONE DETECTED NONE DETECTED   Cocaine Metabolite,Ur [place] POSITIVE (A) NONE DETECTED   Opiate, Ur Screen NONE DETECTED NONE DETECTED   Phencyclidine (PCP) Ur S NONE DETECTED NONE DETECTED   Cannabinoid 50 Ng, Ur [place] NONE DETECTED NONE DETECTED   Barbiturates, Ur Screen NONE DETECTED NONE DETECTED   Benzodiazepine, Ur Scrn NONE DETECTED NONE DETECTED   Methadone Scn, Ur NONE DETECTED NONE DETECTED    Comment: (NOTE) Tricyclics + metabolites, urine    Cutoff 1000 ng/mL Amphetamines + metabolites, urine  Cutoff 1000 ng/mL MDMA (Ecstasy), urine              Cutoff 500  ng/mL Cocaine Metabolite, urine          Cutoff 300 ng/mL Opiate + metabolites, urine        Cutoff 300 ng/mL Phencyclidine (PCP), urine         Cutoff 25 ng/mL Cannabinoid, urine                 Cutoff 50 ng/mL Barbiturates + metabolites, urine  Cutoff 200 ng/mL Benzodiazepine, urine              Cutoff 200 ng/mL Methadone, urine                   Cutoff 300 ng/mL  The urine drug screen provides only a preliminary, unconfirmed analytical test result and should not be used for non-medical purposes. Clinical consideration and professional judgment should be applied to any positive drug screen result due to possible interfering substances. A more specific alternate chemical method must be used in order to obtain a confirmed analytical result. Gas chromatography / mass spectrometry (  GC/MS) is the preferred confirm atory method. Performed at [facility], [address] [place], [place] [address]     Blood Alcohol level:  Lab Results  Component Value Date   [facility] 2 <10 06/29/2022   ETH <10 02/06/2022    Metabolic Disorder Labs:  Lab Results  Component Value Date   HGBA1C 5.6 12/23/2021   MPG 114 12/23/2021   No results found for: "PROLACTIN" Lab Results  Component Value Date   CHOL 179 12/23/2021   TRIG 178 (H) 12/23/2021   HDL 49 12/23/2021   CHOLHDL 3.7 12/23/2021   VLDL 36 12/23/2021   LDLCALC 94 12/23/2021    Current Medications: Current Facility-Administered Medications  Medication Dose Route Frequency Provider Last Rate Last Admin   acetaminophen (TYLENOL) tablet 650 mg  650 mg Oral Q6H PRN Mozetta Murfin, Jackquline Denmark, MD       alum & mag hydroxide-simeth (MAALOX/MYLANTA) 200-200-20 MG/5ML suspension 30 mL  30 mL Oral Q4H PRN Annalyssa Thune T, MD       benztropine (COGENTIN) tablet 0.5 mg  0.5 mg Oral Daily Makaylee Spielberg T, MD   0.5 mg at 07/01/22 0855   haloperidol (HALDOL) tablet 2 mg  2 mg Oral BID Laurisa Sahakian T, MD   2 mg at 07/01/22 0855   hydrOXYzine (ATARAX)  tablet 25 mg  25 mg Oral TID PRN Vanetta Mulders, NP   25 mg at 06/30/22 1148   magnesium hydroxide (MILK OF MAGNESIA) suspension 30 mL  30 mL Oral Daily PRN Keaunna Skipper, Jackquline Denmark, MD       PTA Medications: Medications Prior to Admission  Medication Sig Dispense Refill Last Dose   benztropine (COGENTIN) 0.5 MG tablet Take 1 tablet (0.5 mg total) by mouth daily. 30 tablet 2 Past Month   haloperidol (HALDOL) 1 MG tablet Take 2 mg by mouth 2 (two) times daily.   Past Month   brompheniramine-pseudoephedrine-DM 30-2-10 MG/5ML syrup Take 5 mLs by mouth 4 (four) times daily as needed. (Patient not taking: Reported on 06/29/2022) 120 mL 0    doxycycline (VIBRAMYCIN) 100 MG capsule Take 1 capsule (100 mg total) by mouth 2 (two) times daily. (Patient not taking: Reported on 06/29/2022) 20 capsule 0     Musculoskeletal: Strength & Muscle Tone: within normal limits Gait & Station: normal Patient leans: N/A            Psychiatric Specialty Exam:  Presentation  General Appearance:  Appropriate for Environment  Eye Contact: Fair  Speech: Clear and Coherent  Speech Volume: Normal  Handedness:No data recorded  Mood and Affect  Mood: Depressed  Affect: Congruent   Thought Process  Thought Processes: Coherent  Duration of Psychotic Symptoms: Less than six months  Past Diagnosis of Schizophrenia or Psychoactive disorder: No  Descriptions of Associations:Intact  Orientation:Full (Time, Place and Person)  Thought Content:WDL  Hallucinations:No data recorded Ideas of Reference:Paranoia; Delusions  Suicidal Thoughts:No data recorded Homicidal Thoughts:No data recorded  Sensorium  Memory: Immediate Fair  Judgment: Fair  Insight: Poor   Executive Functions  Concentration: Poor  Attention Span: Fair  Recall: [employer] of Knowledge: Fair  Language: Fair   Psychomotor Activity  Psychomotor Activity:No data recorded  Assets  Assets: Physical  Health; Resilience; Financial Resources/Insurance; Desire for Improvement   Sleep  Sleep:No data recorded   Physical Exam: Physical Exam Vitals and nursing note reviewed.  Constitutional:      Appearance: Normal appearance.  HENT:     Head: Normocephalic and atraumatic.  Mouth/Throat:     Pharynx: Oropharynx is clear.  Eyes:     Pupils: Pupils are equal, round, and reactive to light.  Cardiovascular:     Rate and Rhythm: Normal rate and regular rhythm.  Pulmonary:     Effort: Pulmonary effort is normal.     Breath sounds: Normal breath sounds.  Abdominal:     General: Abdomen is flat.     Palpations: Abdomen is soft.  Musculoskeletal:        General: Normal range of motion.  Skin:    General: Skin is warm and dry.  Neurological:     General: No focal deficit present.     Mental Status: He is alert. Mental status is at baseline.  Psychiatric:        Mood and Affect: Mood is depressed. Affect is blunt.        Speech: Speech normal.        Behavior: Behavior is cooperative.        Thought Content: Thought content includes suicidal ideation. Thought content does not include suicidal plan.        Cognition and Memory: Memory is impaired.    Review of Systems  Constitutional: Negative.   HENT: Negative.    Eyes: Negative.   Respiratory: Negative.    Cardiovascular: Negative.   Gastrointestinal: Negative.   Musculoskeletal: Negative.   Skin: Negative.   Neurological: Negative.   Psychiatric/Behavioral:  Positive for depression, hallucinations, substance abuse and suicidal ideas.    Blood pressure 111/67, pulse 79, temperature 97.7 F (36.5 C), temperature source Oral, resp. rate 18, height 5\' 9"  (1.753 m), weight 98 kg, SpO2 99 %. Body mass index is 31.9 kg/m.  Treatment Plan Summary: Medication management and Plan patient appears to be stable physically.  He was appreciative of being back on psychiatric medicine.  Supportive counseling and encouragement.   Encouraged him to talk with social work and to think about where he will be going after this.  I am not sure whether he will be eligible for any kind of rehab as he has been to several in the past but we can discuss that if he is interested.  Include in individual and group therapy.  Observation Level/Precautions:  15 minute checks  Laboratory:  Chemistry Profile  Psychotherapy:    Medications:    Consultations:    Discharge Concerns:    Estimated LOS:  Other:     Physician Treatment Plan for Primary Diagnosis: Psychoactive substance-induced psychosis (HCC) Long Term Goal(s): Improvement in symptoms so as ready for discharge  Short Term Goals: Ability to verbalize feelings will improve, Ability to demonstrate self-control will improve, and Ability to identify and develop effective coping behaviors will improve  Physician Treatment Plan for Secondary Diagnosis: Principal Problem:   Psychoactive substance-induced psychosis (HCC) Active Problems:   Cocaine-induced psychotic disorder with hallucinations (HCC)   Cocaine abuse (HCC)  Long Term Goal(s): Improvement in symptoms so as ready for discharge  Short Term Goals: Ability to identify triggers associated with substance abuse/mental health issues will improve  I certify that inpatient services furnished can reasonably be expected to improve the patient's condition.    , MD 10/12/20231:31 PM

## 2022-07-01 NOTE — Plan of Care (Signed)
Pt continues to be isolative.  Compliant with medications.  Passive SI.  Contracts for safety.  Spends most of time in room sleeping.  Pt has flat affect.  Continued monitoring Q15 minutes for safety.

## 2022-07-01 NOTE — [facility] Counselor (Addendum)
Adult Comprehensive Assessment  Patient ID: Jeremy [name], male   DOB: 1961/02/27, 61 y.o.   MRN: [id number]  Information Source: Information source: Patient  Current Stressors:  Patient states their primary concerns and needs for treatment are:: During assessment, patient states he has been experiencing much of the same auditory hallucinations. Consistently reports hearing voices described as a black money and a white woman who consistently voice demeaning statements. Patient states their goals for this hospitilization and ongoing recovery are:: States his goal for hospitalization is to "increase in his medications." Educational / Learning stressors: none reported Employment / Job issues: patient remains unemployed Family Relationships: states that he does not have much family Armed forces operational officer / Lack of resources (include bankruptcy): state he "has no finances." Housing / Lack of housing: patient has been chronically homeless for the past 7 months Physical health (include injuries & life threatening diseases): none reported Social relationships: patient reports he isolates to self Substance abuse: endorses active alcohol and cocaine misuse Bereavement / Loss: none reported  Living/Environment/Situation:  Living Arrangements: Alone Living conditions (as described by patient or guardian): Patient is homeless Who else lives in the home?: n/a How long has patient lived in current situation?: n/a What is atmosphere in current home: Chaotic  Family History:  Marital status: Single Are you sexually active?: No What is your sexual orientation?: hetersexual Does patient have children?: Yes How many children?: 4 How is patient's relationship with their children?: Pt reported having a distant relationship with adult children.  Childhood History:  By whom was/is the patient raised?: Mother Additional childhood history information: Reports his mother was hardly around due to  work. Description of patient's relationship with caregiver when they were a child: States his relationship with his mother has always been strained; reports his relationship with his father was strained and distant during childhood. Patient's description of current relationship with people who raised him/her: States that his relationship with his mothe remains strained; father is deceased. How were you disciplined when you got in trouble as a child/adolescent?: physical reprimanded WNL, patient believes punishment was not excessive. Does patient have siblings?: Yes Number of Siblings: 1 Description of patient's current relationship with siblings: repoorts no realtionship with brother. Did patient suffer any verbal/emotional/physical/sexual abuse as a child?: No Did patient suffer from severe childhood neglect?: No Has patient ever been sexually abused/assaulted/raped as an adolescent or adult?: No Was the patient ever a victim of a crime or a disaster?: No Witnessed domestic violence?: No Has patient been affected by domestic violence as an adult?: No  Education:  Highest grade of school patient has completed: HS diploma; some college Currently a Consulting civil engineer?: No Learning disability?: No  Employment/Work Situation:   Employment Situation: Unemployed (duration unknown/unclear) Patient's Job has Been Impacted by Current Illness: Yes What is the Longest Time Patient has Held a Job?: 2-3 years Where was the Patient Employed at that Time?: [employer], plumber Has Patient ever Been in the 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?: No  Financial Resources:   Financial resources: No income Does patient have a Lawyer or guardian?: No  Alcohol/Substance Abuse:   Social History   Substance and Sexual Activity  Alcohol  Use Yes   Comment: per pt he drinks a pint daily   Social History   Substance and Sexual Activity  Drug Use Yes   Types:  Cocaine   Alcohol/Substance Abuse Treatment Hx: Past Tx, Inpatient, Past Tx, Outpatient, Past detox Has alcohol/substance abuse ever caused legal problems?:  (unknown)  Social Support System:   Heritage manager System: None Describe Community Support System: patient reports little/no support system Type of faith/religion: none reported How does patient's faith help to cope with current illness?: none reported  Leisure/Recreation:   Do You Have Hobbies?: No (patient denies)  Strengths/Needs:   Patient states these barriers may affect/interfere with their treatment: none reported Patient states these barriers may affect their return to the community: patient is homeless, CSW to discuss housing resources with patient at discharge Other important information patient would like considered in planning for their treatment: none reported  Discharge Plan:   Currently receiving community mental health services: No Does patient have access to transportation?: No Does patient have financial barriers related to discharge medications?: Yes (no insurance listed) Will patient be returning to same living situation after discharge?:  (tbd)  Summary/Recommendations:   Summary and Recommendations (to be completed by the evaluator): 61 y/o male w/ dx of psychoactive substance induced psychosis from [employer] w/ no listed insurance admitted due to auditory hallucinations; duration of symptoms unknown/unclear.  During assessment, patient states he has been experiencing much of the same auditory hallucinations. Consistently reports hearing voices described as a black money and a white woman who consistently voice demeaning statements. States his goal for hospitalization is to "increase in his medications."  Patient presents as calm, cooperative, and polite. Affect is  depressed, congruent with mood and context. Appearance is WNL. Speech volume, speed, and content is WNL. No evidence of memory or concentration impairment. Patient oriented to person, place, time, and situation. Currently endorsing auditory hallucinations.   Patient is not currently associated with outpatient mental health services, has signed consent to for CSW to make appropriate referrals. Therapeutic recommendations include further crisis stabilization, medication management, group therapy, and case management.    Durenda Hurt. 07/01/2022

## 2022-07-01 NOTE — BHH Suicide Risk Assessment (Signed)
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  61 year old man with a history of substance abuse and psychosis.  Presented complaining of auditory hallucinations and suicidal ideation.  Patient has been calm appropriate and cooperative on the unit.  He currently denies suicidal intent and has only passive suicidal ideation.  Hallucinations are improved  Continued Clinical Symptoms:  Alcohol Use Disorder Identification Test Final Score (AUDIT): 3 The "Alcohol Use Disorders Identification Test", Guidelines for Use in Primary Care, Second Edition.  World Pharmacologist [facility]). Score between 0-7:  no or low risk or alcohol related problems. Score between 8-15:  moderate risk of alcohol related problems. Score between 16-19:  high risk of alcohol related problems. Score 20 or above:  warrants further diagnostic evaluation for alcohol dependence and treatment.   CLINICAL FACTORS:   Depression:   Comorbid alcohol abuse/dependence Alcohol/Substance Abuse/Dependencies Schizophrenia:   Paranoid or undifferentiated type   Musculoskeletal: Strength & Muscle Tone: within normal limits Gait & Station: normal Patient leans: N/A  Psychiatric Specialty Exam:  Presentation  General Appearance:  Appropriate for Environment  Eye Contact: Fair  Speech: Clear and Coherent  Speech  Volume: Normal  Handedness:No data recorded  Mood and Affect  Mood: Depressed  Affect: Congruent   Thought Process  Thought Processes: Coherent  Descriptions of Associations:Intact  Orientation:Full (Time, Place and Person)  Thought Content:WDL  History of Schizophrenia/Schizoaffective disorder:No  Duration of Psychotic Symptoms:Less than six months  Hallucinations:No data recorded Ideas of Reference:Paranoia; Delusions  Suicidal Thoughts:No data recorded Homicidal Thoughts:No data recorded  Sensorium  Memory: Immediate Fair  Judgment: Fair  Insight: Poor   Executive Functions  Concentration: Poor  Attention Span: Fair  Recall: [employer] of Knowledge: Fair  Language: Fair   Psychomotor Activity  Psychomotor Activity:No data recorded  Assets  Assets: Physical Health; Resilience; Financial Resources/Insurance; Desire for Improvement   Sleep  Sleep:No data recorded   Physical Exam: Physical Exam Vitals and nursing note reviewed.  Constitutional:      Appearance: Normal appearance.  HENT:     Head: Normocephalic and atraumatic.     Mouth/Throat:     Pharynx: Oropharynx is clear.  Eyes:     Pupils: Pupils are equal, round, and reactive to light.  Cardiovascular:     Rate and Rhythm: Normal rate and regular rhythm.  Pulmonary:     Effort: Pulmonary effort is normal.     Breath sounds: Normal breath sounds.  Abdominal:     General: Abdomen is flat.     Palpations: Abdomen is soft.  Musculoskeletal:        General: Normal range of motion.  Skin:    General: Skin is warm and dry.  Neurological:     General: No focal deficit present.     Mental Status: He is alert. Mental status is at baseline.  Psychiatric:        Attention and Perception: Attention normal.        Mood and Affect: Mood normal.  Speech: Speech normal.        Behavior: Behavior is cooperative.        Thought Content: Thought content includes suicidal  ideation. Thought content does not include suicidal plan.        Cognition and Memory: Cognition normal.        Judgment: Judgment normal.    Review of Systems  Constitutional: Negative.   HENT: Negative.    Eyes: Negative.   Respiratory: Negative.    Cardiovascular: Negative.   Gastrointestinal: Negative.   Musculoskeletal: Negative.   Skin: Negative.   Neurological: Negative.   Psychiatric/Behavioral:  Positive for depression and suicidal ideas.    Blood pressure 111/67, pulse 79, temperature 97.7 F (36.5 C), temperature source Oral, resp. rate 18, height 5\' 9"  (1.753 m), weight 98 kg, SpO2 99 %. Body mass index is 31.9 kg/m.   COGNITIVE FEATURES THAT CONTRIBUTE TO RISK:  Thought constriction (tunnel vision)    SUICIDE RISK:   Minimal: No identifiable suicidal ideation.  Patients presenting with no risk factors but with morbid ruminations; may be classified as minimal risk based on the severity of the depressive symptoms  PLAN OF CARE: Patient will be restarted on antipsychotic medication.  Daily reassessment of dangerousness prior to discharge planning.  I certify that inpatient services furnished can reasonably be expected to improve the patient's condition.   , MD 07/01/2022, 1:28 PM

## 2022-07-01 NOTE — Progress Notes (Signed)
Patient isolative to his room.  Labile mood. Asked not to be bothered when approached after change of shift.  Patient did however come out for snack later, but promptly went back to room afterward.  No interaction with staff or peers.  Has no medication for tonight and when asked would he need any medication to help aid in sleep patient declined.  Will continue to monitor and try to build rapport of patient will allow. Q15 minute safety checks in place.      C Butler-Nicholson, LPN

## 2022-07-01 NOTE — Plan of Care (Signed)
  Problem: Education: Goal: Knowledge of General Education information will improve Description: Including pain rating scale, medication(s)/side effects and non-pharmacologic comfort measures Outcome: Progressing   Problem: Nutrition: Goal: Adequate nutrition will be maintained Outcome: Progressing   Problem: Coping: Goal: Coping ability will improve Outcome: Progressing Goal: Will verbalize feelings Outcome: Progressing

## 2022-07-02 DIAGNOSIS — F19959 Other psychoactive substance use, unspecified with psychoactive substance-induced psychotic disorder, unspecified: Secondary | ICD-10-CM | POA: Diagnosis not present

## 2022-07-02 MED ORDER — HALOPERIDOL 1 MG PO TABS
4.0000 mg | ORAL_TABLET | Freq: Two times a day (BID) | ORAL | Status: DC
  Administered 2022-07-02 – 2022-07-05 (×6): 4 mg via ORAL
  Filled 2022-07-02 (×6): qty 4

## 2022-07-02 MED ORDER — TRAZODONE HCL 100 MG PO TABS
100.0000 mg | ORAL_TABLET | Freq: Every day | ORAL | Status: DC
  Administered 2022-07-02: 100 mg via ORAL
  Filled 2022-07-02: qty 1

## 2022-07-02 NOTE — Progress Notes (Signed)
[facility] MD Progress Note  07/02/2022 3:37 PM Jeremy [name]  MRN:  [id number] Subjective: Patient seen and chart reviewed.  Follow-up for this 61 year old man with a history of psychotic symptoms and substance use problems.  Patient also attended treatment team today.  Patient states he is feeling slightly better but still remains depressed with some fleeting suicidal thoughts.  He had trouble sleeping last night and also reports he is still having auditory hallucinations mostly at night.  Patient was cooperative insightful and polite at treatment team meeting and expressed interest in trying to find some safe place for him to stay in the future. Principal Problem: Psychoactive substance-induced psychosis (HCC) Diagnosis: Principal Problem:   Psychoactive substance-induced psychosis (HCC) Active Problems:   Cocaine-induced psychotic disorder with hallucinations (HCC)   Cocaine abuse (HCC)  Total Time spent with patient: 30 minutes  Past Psychiatric History: Past history of recurrent episodes of psychotic symptoms and depression when accompanied by substance use  Past Medical History:  Past Medical History:  Diagnosis Date   Schizophrenia (HCC)    History reviewed. No pertinent surgical history. Family History: History reviewed. No pertinent family history. Family Psychiatric  History: See previous Social History:  Social History   Substance and Sexual Activity  Alcohol Use Yes   Comment: per pt he drinks a pint daily     Social History   Substance and Sexual Activity  Drug Use Yes   Types: Cocaine    Social History   Socioeconomic History   Marital status: Single    Spouse name: Not on file   Number of children: Not on file   Years of education: Not on file   Highest education level: Not on file  Occupational History   Not on file  Tobacco Use   Smoking status: Never   Smokeless tobacco: Never  Vaping Use   Vaping Use: Never used  Substance and Sexual Activity    Alcohol use: Yes    Comment: per pt he drinks a pint daily   Drug use: Yes    Types: Cocaine   Sexual activity: Not Currently  Other Topics Concern   Not on file  Social History Narrative   Not on file   Social Determinants of Health   Financial Resource Strain: Not on file  Food Insecurity: Unknown (06/30/2022)   Hunger Vital Sign    Worried About Running Out of Food in the Last Year: Patient refused    Ran Out of Food in the Last Year: Patient refused  Transportation Needs: Unknown (06/30/2022)   PRAPARE - Administrator, Civil Service (Medical): Patient refused    Lack of Transportation (Non-Medical): Patient refused  Physical Activity: Not on file  Stress: Not on file  Social Connections: Not on file   Additional Social History:                         Sleep: Fair  Appetite:  Fair  Current Medications: Current Facility-Administered Medications  Medication Dose Route Frequency Provider Last Rate Last Admin   acetaminophen (TYLENOL) tablet 650 mg  650 mg Oral Q6H PRN Imri Lor T, MD       alum & mag hydroxide-simeth (MAALOX/MYLANTA) 200-200-20 MG/5ML suspension 30 mL  30 mL Oral Q4H PRN Alcus Bradly T, MD       benztropine (COGENTIN) tablet 0.5 mg  0.5 mg Oral Daily Larrisa Cravey T, MD   0.5 mg at 07/02/22 [phone]  haloperidol (HALDOL) tablet 4 mg  4 mg Oral BID Keaja Reaume, Jackquline Denmark, MD       hydrOXYzine (ATARAX) tablet 25 mg  25 mg Oral TID PRN Vanetta Mulders, NP   25 mg at 07/02/22 0800   magnesium hydroxide (MILK OF MAGNESIA) suspension 30 mL  30 mL Oral Daily PRN [place], Jackquline Denmark, MD       traZODone (DESYREL) tablet 100 mg  100 mg Oral QHS Dilia Alemany, Jackquline Denmark, MD        Lab Results:  Results for orders placed or performed during the hospital encounter of 06/30/22 (from the past 48 hour(s))  Hemoglobin A1c     Status: None   Collection Time: 07/01/22  3:40 PM  Result Value Ref Range   Hgb A1c MFr Bld 5.6 4.8 - 5.6 %    Comment: (NOTE) Pre  diabetes:          5.7%-6.4%  Diabetes:              >6.4%  Glycemic control for   <7.0% adults with diabetes    Mean Plasma Glucose 114.02 mg/dL    Comment: Performed at [facility], [address], [place], [place] [address]  Lipid panel     Status: Abnormal   Collection Time: 07/01/22  3:40 PM  Result Value Ref Range   Cholesterol 191 0 - 200 mg/dL   Triglycerides 81 <295 mg/dL   HDL 49 >28 mg/dL   Total CHOL/HDL Ratio 3.9 RATIO   VLDL 16 0 - 40 mg/dL   LDL Cholesterol 413 (H) 0 - 99 mg/dL    Comment:        Total Cholesterol/HDL:CHD Risk Coronary Heart Disease Risk Table                     Men   Women  1/2 Average Risk   3.4   3.3  Average Risk       5.0   4.4  2 X Average Risk   9.6   7.1  3 X Average Risk  23.4   11.0        Use the calculated Patient Ratio above and the CHD Risk Table to determine the patient's CHD Risk.        ATP III CLASSIFICATION (LDL):  <100     mg/dL   Optimal  244-010  mg/dL   Near or Above                    Optimal  130-159  mg/dL   Borderline  272-536  mg/dL   High  >644     mg/dL   Very High Performed at [facility], [address] [place], [place] [address]     Blood Alcohol level:  Lab Results  Component Value Date   [facility] <10 06/29/2022   ETH <10 02/06/2022    Metabolic Disorder Labs: Lab Results  Component Value Date   HGBA1C 5.6 07/01/2022   MPG 114.02 07/01/2022   MPG 114 12/23/2021   No results found for: "PROLACTIN" Lab Results  Component Value Date   CHOL 191 07/01/2022   TRIG 81 07/01/2022   HDL 49 07/01/2022   CHOLHDL 3.9 07/01/2022   VLDL 16 07/01/2022   LDLCALC 126 (H) 07/01/2022   LDLCALC 94 12/23/2021    Physical Findings: AIMS:  , ,  ,  ,    CIWA:    COWS:  Musculoskeletal: Strength & Muscle Tone: within normal limits Gait & Station: normal Patient leans: N/A  Psychiatric Specialty Exam:  Presentation  General Appearance:  Appropriate for Environment  Eye  Contact: Fair  Speech: Clear and Coherent  Speech Volume: Normal  Handedness:No data recorded  Mood and Affect  Mood: Depressed  Affect: Congruent   Thought Process  Thought Processes: Coherent  Descriptions of Associations:Intact  Orientation:Full (Time, Place and Person)  Thought Content:WDL  History of Schizophrenia/Schizoaffective disorder:No  Duration of Psychotic Symptoms:Less than six months  Hallucinations:No data recorded Ideas of Reference:Paranoia; Delusions  Suicidal Thoughts:No data recorded Homicidal Thoughts:No data recorded  Sensorium  Memory: Immediate Fair  Judgment: Fair  Insight: Poor   Executive Functions  Concentration: Poor  Attention Span: Fair  Recall: [employer] of Knowledge: Fair  Language: Fair   Psychomotor Activity  Psychomotor Activity:No data recorded  Assets  Assets: Physical Health; Resilience; Financial Resources/Insurance; Desire for Improvement   Sleep  Sleep:No data recorded   Physical Exam: Physical Exam Vitals reviewed.  Constitutional:      Appearance: Normal appearance.  HENT:     Head: Normocephalic and atraumatic.     Mouth/Throat:     Pharynx: Oropharynx is clear.  Eyes:     Pupils: Pupils are equal, round, and reactive to light.  Cardiovascular:     Rate and Rhythm: Normal rate and regular rhythm.  Pulmonary:     Effort: Pulmonary effort is normal.     Breath sounds: Normal breath sounds.  Abdominal:     General: Abdomen is flat.     Palpations: Abdomen is soft.  Musculoskeletal:        General: Normal range of motion.  Skin:    General: Skin is warm and dry.  Neurological:     General: No focal deficit present.     Mental Status: He is alert. Mental status is at baseline.  Psychiatric:        Attention and Perception: He is inattentive. He perceives auditory hallucinations.        Mood and Affect: Mood normal. Affect is blunt.        Speech: Speech is delayed.         Behavior: Behavior is slowed.        Thought Content: Thought content includes suicidal ideation.        Cognition and Memory: Memory is impaired.    Review of Systems  Constitutional: Negative.   HENT: Negative.    Eyes: Negative.   Respiratory: Negative.    Cardiovascular: Negative.   Gastrointestinal: Negative.   Musculoskeletal: Negative.   Skin: Negative.   Neurological: Negative.   Psychiatric/Behavioral:  Positive for depression, hallucinations and suicidal ideas. The patient is nervous/anxious and has insomnia.    Blood pressure 109/76, pulse 82, temperature 98 F (36.7 C), temperature source Oral, resp. rate 18, height 5\' 9"  (1.753 m), weight 98 kg, SpO2 98 %. Body mass index is 31.9 kg/m.   Treatment Plan Summary: Medication management and Plan reviewed chart.  Add trazodone at night for sleep.  Increase dose of Haldol to 4 mg twice a day.  Supportive therapy and encouragement especially encouraging patient to work with staff about plans for the future.  Alethia Berthold, MD 07/02/2022, 3:37 PM

## 2022-07-02 NOTE — BH IP Treatment Plan (Signed)
Interdisciplinary Treatment and Diagnostic Plan Update  07/02/2022 Time of Session: 09:38 Jeremy [name] MRN: [id number]  Principal Diagnosis: Psychoactive substance-induced psychosis (HCC)  Secondary Diagnoses: Principal Problem:   Psychoactive substance-induced psychosis (HCC) Active Problems:   Cocaine-induced psychotic disorder with hallucinations (HCC)   Cocaine abuse (HCC)   Current Medications:  Current Facility-Administered Medications  Medication Dose Route Frequency Provider Last Rate Last Admin   acetaminophen (TYLENOL) tablet 650 mg  650 mg Oral Q6H PRN Clapacs, Jackquline Denmark, MD       alum & mag hydroxide-simeth (MAALOX/MYLANTA) 200-200-20 MG/5ML suspension 30 mL  30 mL Oral Q4H PRN Clapacs, Jackquline Denmark, MD       benztropine (COGENTIN) tablet 0.5 mg  0.5 mg Oral Daily Clapacs, John T, MD   0.5 mg at 07/02/22 0759   haloperidol (HALDOL) tablet 2 mg  2 mg Oral BID Clapacs, John T, MD   2 mg at 07/02/22 0759   hydrOXYzine (ATARAX) tablet 25 mg  25 mg Oral TID PRN Vanetta Mulders, NP   25 mg at 07/02/22 0800   magnesium hydroxide (MILK OF MAGNESIA) suspension 30 mL  30 mL Oral Daily PRN Clapacs, Jackquline Denmark, MD       PTA Medications: Medications Prior to Admission  Medication Sig Dispense Refill Last Dose   benztropine (COGENTIN) 0.5 MG tablet Take 1 tablet (0.5 mg total) by mouth daily. 30 tablet 2 Past Month   haloperidol (HALDOL) 1 MG tablet Take 2 mg by mouth 2 (two) times daily.   Past Month   brompheniramine-pseudoephedrine-DM 30-2-10 MG/5ML syrup Take 5 mLs by mouth 4 (four) times daily as needed. (Patient not taking: Reported on 06/29/2022) 120 mL 0    doxycycline (VIBRAMYCIN) 100 MG capsule Take 1 capsule (100 mg total) by mouth 2 (two) times daily. (Patient not taking: Reported on 06/29/2022) 20 capsule 0     Patient Stressors: Financial difficulties   Medication change or noncompliance   Substance abuse    Patient Strengths: Ability for insight  Average or above  average intelligence  Communication skills   Treatment Modalities: Medication Management, Group therapy, Case management,  1 to 1 session with clinician, Psychoeducation, Recreational therapy.   Physician Treatment Plan for Primary Diagnosis: Psychoactive substance-induced psychosis (HCC) Long Term Goal(s): Improvement in symptoms so as ready for discharge   Short Term Goals: Ability to identify triggers associated with substance abuse/mental health issues will improve Ability to verbalize feelings will improve Ability to demonstrate self-control will improve Ability to identify and develop effective coping behaviors will improve  Medication Management: Evaluate patient's response, side effects, and tolerance of medication regimen.  Therapeutic Interventions: 1 to 1 sessions, Unit Group sessions and Medication administration.  Evaluation of Outcomes: Progressing  Physician Treatment Plan for Secondary Diagnosis: Principal Problem:   Psychoactive substance-induced psychosis (HCC) Active Problems:   Cocaine-induced psychotic disorder with hallucinations (HCC)   Cocaine abuse (HCC)  Long Term Goal(s): Improvement in symptoms so as ready for discharge   Short Term Goals: Ability to identify triggers associated with substance abuse/mental health issues will improve Ability to verbalize feelings will improve Ability to demonstrate self-control will improve Ability to identify and develop effective coping behaviors will improve     Medication Management: Evaluate patient's response, side effects, and tolerance of medication regimen.  Therapeutic Interventions: 1 to 1 sessions, Unit Group sessions and Medication administration.  Evaluation of Outcomes: Progressing   RN Treatment Plan for Primary Diagnosis: Psychoactive substance-induced psychosis (HCC) Long Term Goal(s): Knowledge  of disease and therapeutic regimen to maintain health will improve  Short Term Goals: Ability to  remain free from injury will improve, Ability to verbalize frustration and anger appropriately will improve, Ability to demonstrate self-control, Ability to participate in decision making will improve, Ability to verbalize feelings will improve, Ability to disclose and discuss suicidal ideas, Ability to identify and develop effective coping behaviors will improve, and Compliance with prescribed medications will improve  Medication Management: RN will administer medications as ordered by provider, will assess and evaluate patient's response and provide education to patient for prescribed medication. RN will report any adverse and/or side effects to prescribing provider.  Therapeutic Interventions: 1 on 1 counseling sessions, Psychoeducation, Medication administration, Evaluate responses to treatment, Monitor vital signs and CBGs as ordered, Perform/monitor CIWA, COWS, AIMS and Fall Risk screenings as ordered, Perform wound care treatments as ordered.  Evaluation of Outcomes: Progressing   LCSW Treatment Plan for Primary Diagnosis: Psychoactive substance-induced psychosis ([place]) Long Term Goal(s): Safe transition to appropriate next level of care at discharge, Engage patient in therapeutic group addressing interpersonal concerns.  Short Term Goals: Engage patient in aftercare planning with referrals and resources, Increase social support, Increase ability to appropriately verbalize feelings, Increase emotional regulation, Facilitate acceptance of mental health diagnosis and concerns, Facilitate patient progression through stages of change regarding substance use diagnoses and concerns, Identify triggers associated with mental health/substance abuse issues, and Increase skills for wellness and recovery  Therapeutic Interventions: Assess for all discharge needs, 1 to 1 time with Social worker, Explore available resources and support systems, Assess for adequacy in community support network, Educate family  and significant other(s) on suicide prevention, Complete Psychosocial Assessment, Interpersonal group therapy.  Evaluation of Outcomes: Progressing   Progress in Treatment: Attending groups: No. Participating in groups: No. Taking medication as prescribed: Yes. Toleration medication: Yes. Family/Significant other contact made: No, will contact:  if given permission. Patient understands diagnosis: Yes. Discussing patient identified problems/goals with staff: Yes. Medical problems stabilized or resolved: Yes. Denies suicidal/homicidal ideation: Yes. Issues/concerns per patient self-inventory: No. Other: none.  New problem(s) identified: No, Describe:  none identified.  New Short Term/Long Term Goal(s): detox, elimination of symptoms of psychosis, medication management for mood stabilization; elimination of SI thoughts; development of comprehensive mental wellness/sobriety plan.  Patient Goals:  "My main thing is getting out of here with somewhere to go. Medications too."  Discharge Plan or Barriers: CSW will assist pt with development of an appropriate aftercare/discharge plan.    Reason for Continuation of Hospitalization: Hallucinations Medication stabilization Suicidal ideation  Estimated Length of Stay: 1-7 days  Last 3 [place] Suicide Severity Risk Score: Flowsheet Row Admission (Current) from 06/30/2022 in [facility] from 06/29/2022 in [facility] from 03/03/2022 in [facility] Low Risk No Risk No Risk       Last PHQ 2/9 Scores:     No data to display          Scribe for Treatment Team: Shirl Harris, LCSW 07/02/2022 2:03 PM

## 2022-07-02 NOTE — Plan of Care (Signed)
  Problem: Education: Goal: Knowledge of General Education information will improve Description: Including pain rating scale, medication(s)/side effects and non-pharmacologic comfort measures Outcome: Progressing   Problem: Activity: Goal: Risk for activity intolerance will decrease Outcome: Progressing   Problem: Nutrition: Goal: Adequate nutrition will be maintained Outcome: Progressing   Problem: Coping: Goal: Level of anxiety will decrease Outcome: Progressing   Problem: Safety: Goal: Ability to remain free from injury will improve Outcome: Progressing   Problem: Coping: Goal: Coping ability will improve Outcome: Progressing

## 2022-07-02 NOTE — Progress Notes (Signed)
Much better and brighter affect.  Is more engaging with his peers this evening.  Still endorsing passive si and auditory hallucinations but states that his symptoms are getting better.  Continues to contract for safety. Approached nurses station asking if he had any medication to help aid in sleep.  Reports not sleeping well and admits to having sleep issues prior to coming into the hospital.  Advised him to talk to the doctor and be forthcoming with his symptoms.  Patient has agreed. Support and encouragement offered. Q15 minute safety checks in place.       C Butler-Nicholson, LPN

## 2022-07-02 NOTE — Group Note (Addendum)
[facility] LCSW Group Therapy Note   Group Date: 07/02/2022 Start Time: 1300 End Time: 1400  Type of Therapy and Topic:  Group Therapy:  Feelings around Relapse and Recovery  Participation Level:  Did Not Attend    Description of Group:    Patients in this group will discuss emotions they experience before and after a relapse. They will process how experiencing these feelings, or avoidance of experiencing them, relates to having a relapse. Facilitator will guide patients to explore emotions they have related to recovery. Patients will be encouraged to process which emotions are more powerful. They will be guided to discuss the emotional reaction significant others in their lives may have to patients' relapse or recovery. Patients will be assisted in exploring ways to respond to the emotions of others without this contributing to a relapse.  Therapeutic Goals: Patient will identify two or more emotions that lead to relapse for them:  Patient will identify two emotions that result when they relapse:  Patient will identify two emotions related to recovery:  Patient will demonstrate ability to communicate their needs through discussion and/or role plays.   Summary of Patient Progress: Pt declined to attend group despite invitation.   Therapeutic Modalities:   Cognitive Behavioral Therapy Solution-Focused Therapy Assertiveness Training Relapse Prevention Therapy   Darrelle Wiberg R Mattye Verdone, LCSW 

## 2022-07-02 NOTE — Progress Notes (Signed)
D- Patient is alert and oriented. Patient presented in a depressed and anxious mood on assessment reporting that he did not sleep good last night. He reports passive SI and  AVH but denies HI. He denied any pain at this time.  Patients goal for today is "to get good sleep overnight".   A- Scheduled medications administered to patient, per MD orders. Support and encouragement provided.  Routine safety checks conducted every 15 minutes.  Patient informed to notify staff with problems or concerns.   R- No adverse drug reactions noted. Patient contracts for safety at this time. Patient compliant with medications and treatment plan. Patient receptive, calm, and cooperative. Patient remains safe at this time.

## 2022-07-02 NOTE — Progress Notes (Signed)
Recreation Therapy Notes     Date: 07/02/2022   Time: 1:30pm    Location: Court yard       Behavioral response: N/A   Intervention Topic: Decision Making    Discussion/Intervention: Patient refused to attend group.    Clinical Observations/Feedback:  Patient refused to attend group.    Florina Glas LRT/CTRS        Elenie Coven 07/02/2022 2:00 PM

## 2022-07-03 DIAGNOSIS — F19959 Other psychoactive substance use, unspecified with psychoactive substance-induced psychotic disorder, unspecified: Secondary | ICD-10-CM | POA: Diagnosis not present

## 2022-07-03 MED ORDER — HYDROXYZINE HCL 50 MG PO TABS
50.0000 mg | ORAL_TABLET | Freq: Every day | ORAL | Status: DC
  Administered 2022-07-03 – 2022-07-04 (×2): 50 mg via ORAL
  Filled 2022-07-03 (×2): qty 1

## 2022-07-03 NOTE — Progress Notes (Signed)
Patient alert and oriented x 4, affect is flat but brightens upon approach, he is receptive to staff. Patient denies SI/HI/AVH interacting appropriately with peers and complaint with scheduled medication regimen. 15 minutes safety checks maintained will continue no distress noted, patient in a safe condition.

## 2022-07-03 NOTE — Plan of Care (Signed)
  Problem: Education: Goal: Will be free of psychotic symptoms Outcome: Progressing   Problem: Coping: Goal: Will verbalize feelings Outcome: Progressing

## 2022-07-03 NOTE — Progress Notes (Signed)
[facility] MD Progress Note  07/03/2022 2:06 PM Jeremy [name]  MRN:  [id number] Subjective: Jeremy [name] is seen on rounds.  He states that the trazodone is too strong and would like to try Vistaril instead at bedtime.  He has no other complaints.  Mood and affect are stable.  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  Substance abuse is probably seen as the primary condition although he also has underlying mood and psychotic symptoms that are usually manifest under a great deal of stress.  Has benefited at least temporarily from medication although a continued sobriety has been elusive and would be the most helpful thing.  Has done some cutting in the past but no more serious suicide attempts than that.  Past Medical History:  Past Medical History:  Diagnosis Date   Schizophrenia ([place])    History reviewed. No pertinent surgical history. Family History: History reviewed. No pertinent family history.  Social History:  Social History   Substance and Sexual Activity  Alcohol Use Yes   Comment: per pt he drinks a pint daily     Social History   Substance and Sexual Activity  Drug Use Yes   Types: Cocaine    Social History   Socioeconomic History   Marital status: Single    Spouse name: Not on file   Number of children: Not on file   Years of education: Not on file   Highest education level: Not on file  Occupational History   Not on file  Tobacco Use   Smoking status: Never   Smokeless tobacco: Never  Vaping Use   Vaping Use: Never used  Substance and Sexual Activity   Alcohol use: Yes    Comment: per pt he drinks a pint daily   Drug use: Yes     Types: Cocaine   Sexual activity: Not Currently  Other Topics Concern   Not on file  Social History Narrative   Not on file   Social Determinants of Health   Financial Resource Strain: Not on file  Food Insecurity: Unknown (06/30/2022)   Hunger Vital Sign    Worried About Running Out of Food in the Last Year: Patient refused    [place] in the Last Year: Patient refused  Transportation Needs: Unknown (06/30/2022)   [place] - Hydrologist (Medical): Patient refused    Lack of Transportation (Non-Medical): Patient refused  Physical Activity: Not on file  Stress: Not on file  Social Connections: Not on file   Additional Social History:                         Sleep: Fair  Appetite:  Good  Current Medications: Current Facility-Administered Medications  Medication Dose Route Frequency Provider Last Rate Last Admin   acetaminophen (TYLENOL) tablet 650 mg  650 mg Oral Q6H PRN Clapacs, John T, MD       alum & mag hydroxide-simeth (MAALOX/MYLANTA) 200-200-20 MG/5ML suspension 30 mL  30 mL Oral Q4H PRN Clapacs, John T, MD       benztropine (COGENTIN) tablet 0.5 mg  0.5 mg Oral Daily Clapacs, John T, MD   0.5 mg at 07/03/22 [phone]  haloperidol (HALDOL) tablet 4 mg  4 mg Oral BID Clapacs, Jackquline Denmark, MD   4 mg at 07/03/22 0809   hydrOXYzine (ATARAX) tablet 25 mg  25 mg Oral TID PRN Vanetta Mulders, NP   25 mg at 07/02/22 0800   hydrOXYzine (ATARAX) tablet 50 mg  50 mg Oral QHS Sarina Ill, DO       magnesium hydroxide (MILK OF MAGNESIA) suspension 30 mL  30 mL Oral Daily PRN Clapacs, Jackquline Denmark, MD        Lab Results:  Results for orders placed or performed during the hospital encounter of 06/30/22 (from the past 48 hour(s))  Hemoglobin A1c     Status: None   Collection Time: 07/01/22  3:40 PM  Result Value Ref Range   Hgb A1c MFr Bld 5.6 4.8 - 5.6 %    Comment: (NOTE) Pre diabetes:          5.7%-6.4%  Diabetes:               >6.4%  Glycemic control for   <7.0% adults with diabetes    Mean Plasma Glucose 114.02 mg/dL    Comment: Performed at [facility], [address], [place], [place] [address]  Lipid panel     Status: Abnormal   Collection Time: 07/01/22  3:40 PM  Result Value Ref Range   Cholesterol 191 0 - 200 mg/dL   Triglycerides 81 <010 mg/dL   HDL 49 >27 mg/dL   Total CHOL/HDL Ratio 3.9 RATIO   VLDL 16 0 - 40 mg/dL   LDL Cholesterol 253 (H) 0 - 99 mg/dL    Comment:        Total Cholesterol/HDL:CHD Risk Coronary Heart Disease Risk Table                     Men   Women  1/2 Average Risk   3.4   3.3  Average Risk       5.0   4.4  2 X Average Risk   9.6   7.1  3 X Average Risk  23.4   11.0        Use the calculated Patient Ratio above and the CHD Risk Table to determine the patient's CHD Risk.        ATP III CLASSIFICATION (LDL):  <100     mg/dL   Optimal  664-403  mg/dL   Near or Above                    Optimal  130-159  mg/dL   Borderline  474-259  mg/dL   High  >563     mg/dL   Very High Performed at [facility], [address] [place], [place] [address]     Blood Alcohol level:  Lab Results  Component Value Date   [facility] <10 06/29/2022   ETH <10 02/06/2022    Metabolic Disorder Labs: Lab Results  Component Value Date   HGBA1C 5.6 07/01/2022   MPG 114.02 07/01/2022   MPG 114 12/23/2021   No results found for: "PROLACTIN" Lab Results  Component Value Date   CHOL 191 07/01/2022   TRIG 81 07/01/2022   HDL 49 07/01/2022   CHOLHDL 3.9 07/01/2022   VLDL 16 07/01/2022   LDLCALC 126 (H) 07/01/2022   LDLCALC 94 12/23/2021    Physical Findings: AIMS:  , ,  ,  ,    CIWA:    COWS:  Musculoskeletal: Strength & Muscle Tone: within normal limits Gait & Station: normal Patient leans: N/A  Psychiatric Specialty Exam:  Presentation  General Appearance:  Appropriate for Environment  Eye Contact: Fair  Speech: Clear and Coherent  Speech  Volume: Normal  Handedness:No data recorded  Mood and Affect  Mood: Depressed  Affect: Congruent   Thought Process  Thought Processes: Coherent  Descriptions of Associations:Intact  Orientation:Full (Time, Place and Person)  Thought Content:WDL  History of Schizophrenia/Schizoaffective disorder:No  Duration of Psychotic Symptoms:Less than six months  Hallucinations:No data recorded Ideas of Reference:Paranoia; Delusions  Suicidal Thoughts:No data recorded Homicidal Thoughts:No data recorded  Sensorium  Memory: Immediate Fair  Judgment: Fair  Insight: Poor   Executive Functions  Concentration: Poor  Attention Span: Fair  Recall: [employer] of Knowledge: Fair  Language: Fair   Psychomotor Activity  Psychomotor Activity:No data recorded  Assets  Assets: Physical Health; Resilience; Financial Resources/Insurance; Desire for Improvement   Sleep  Sleep:No data recorded    Blood pressure 98/64, pulse 86, temperature 98 F (36.7 C), temperature source Oral, resp. rate 18, height 5\' 9"  (1.753 m), weight 98 kg, SpO2 98 %. Body mass index is 31.9 kg/m.   Treatment Plan Summary: Daily contact with patient to assess and evaluate symptoms and progress in treatment, Medication management, and Plan discontinue trazodone and start Vistaril 50 mg at bedtime.  , DO 07/03/2022, 2:06 PM

## 2022-07-03 NOTE — Progress Notes (Signed)
Patient presents pleasant and cooperative. Reports he does not wish to take trazodone for sleep. Reported he had a near fall episode in bathroom where he grabbed commode to stop. Reports trazodone made him to groggy and dizzy. Reports it did not work for him anyway, reports he only got about 3 hours of sleep last night. Patient had no other complaints voiced at this time. Denies any SI, Hi, AVH. Medication compliant, appropriate with staff,  minimal interaction at this time with peers.   Encouragement and support provided, safety checks maintained. Medications given as prescribed. Pt receptive and remains safe on unit with q 15 min checks.

## 2022-07-04 DIAGNOSIS — F19959 Other psychoactive substance use, unspecified with psychoactive substance-induced psychotic disorder, unspecified: Secondary | ICD-10-CM | POA: Diagnosis not present

## 2022-07-04 NOTE — Plan of Care (Signed)
  Problem: Education: Goal: Knowledge of General Education information will improve Description: Including pain rating scale, medication(s)/side effects and non-pharmacologic comfort measures Outcome: Progressing   Problem: Coping: Goal: Level of anxiety will decrease Outcome: Progressing   Problem: Pain Managment: Goal: General experience of comfort will improve Outcome: Progressing   Patient alert and cooperative on assessment but guarded.  Patient is medication compliant.  Patient denies AVH, SI/HI, anxiety/ depression at this time.  Patient is isolative to room, states that being around other people causes him to become anxious.  Patient denies pain at this time.  Patient can contract for safety.  Q 15 minute rounds in progress, will continue to monitor.

## 2022-07-04 NOTE — Group Note (Signed)
[facility] LCSW Group Therapy Note   Group Date: 07/04/2022 Start Time: 1300 End Time: 1400   Type of Therapy and Topic: Group Therapy: Avoiding Self-Sabotaging and Enabling Behaviors  Participation Level: Active  Mood: depressed   Description of Group:  In this group, patients will learn how to identify obstacles, self-sabotaging and enabling behaviors, as well as: what are they, why do we do them and what needs these behaviors meet. Discuss unhealthy relationships and how to have positive healthy boundaries with those that sabotage and enable. Explore aspects of self-sabotage and enabling in yourself and how to limit these self-destructive behaviors in everyday life.   Therapeutic Goals: 1. Patient will identify one obstacle that relates to self-sabotage and enabling behaviors 2. Patient will identify one personal self-sabotaging or enabling behavior they did prior to admission 3. Patient will state a plan to change the above identified behavior 4. Patient will demonstrate ability to communicate their needs through discussion and/or role play.    Summary of Patient Progress:   Patient was present for the entirety of the group session. Patient was an active listener and participated in the topic of discussion, provided helpful advice to others, and added nuance to topic of conversation. Patient participate in role playing exercise brainstorming thoughts and feeling which contributed to certain behaviors.    Therapeutic Modalities:  Cognitive Behavioral Therapy Person-Centered Therapy Motivational Interviewing    Durenda Hurt, [place]

## 2022-07-04 NOTE — Progress Notes (Signed)
Patient attending group and in dayroom watching TV  this shift.  Patient verbalizes feeling better and that he believes his medicine is starting to work.  Will continue to monitor.

## 2022-07-04 NOTE — Progress Notes (Signed)
**Jeremy [name] De-Identified via Obfuscation** Jeremy Jeremy [name] Jeremy Jeremy [name]  07/04/2022 11:12 AM Jeremy Jeremy [name]  MRN:  [id number] Subjective: Jeremy Jeremy [name] states that he slept better last night.  I discontinued his trazodone and started him on Vistaril at bedtime.  He does not have any complaints.  No side effects.  Mood and affect are stable.  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  Substance abuse is probably seen as the primary condition although he also has underlying mood and psychotic symptoms that are usually manifest under a great deal of stress.  Has benefited at least temporarily from medication although a continued sobriety has been elusive and would be the most helpful thing.  Has done some cutting in the past but no more serious suicide attempts than that.  Past Medical History:  Past Medical History:  Diagnosis Date   Schizophrenia ([place])    History reviewed. No pertinent surgical history. Family History: History reviewed. No pertinent family history.  Social History:  Social History   Substance and Sexual Activity  Alcohol Use Yes   Comment: per pt he drinks a pint daily     Social History   Substance and Sexual Activity  Drug Use Yes   Types: Cocaine    Social History   Socioeconomic History   Marital status: Single    Spouse name: Not on file   Number of children: Not on file   Years of education: Not on file   Highest education level: Not on file  Occupational History   Not on file  Tobacco Use   Smoking status: Never   Smokeless tobacco: Never  Vaping Use   Vaping Use: Never used  Substance and Sexual Activity   Alcohol use: Yes    Comment: per pt he drinks a pint daily    Drug use: Yes    Types: Cocaine   Sexual activity: Not Currently  Other Topics Concern   Not on file  Social History Narrative   Not on file   Social Determinants of Health   Financial Resource Strain: Not on file  Food Insecurity: Unknown (06/30/2022)   Hunger Vital Sign    Worried About Running Out of Food in the Last Year: Patient refused    [place] in the Last Year: Patient refused  Transportation Needs: Unknown (06/30/2022)   [place] - Hydrologist (Medical): Patient refused    Lack of Transportation (Non-Medical): Patient refused  Physical Activity: Not on file  Stress: Not on file  Social Connections: Not on file   Additional Social History:                         Sleep: Good  Appetite:  Good  Current Medications: Current Facility-Administered Medications  Medication Dose Route Frequency Provider Last Rate Last Admin   acetaminophen (TYLENOL) tablet 650 mg  650 mg Oral Q6H PRN Clapacs, Jeremy Jeremy [name], Jeremy       alum & mag hydroxide-simeth (MAALOX/MYLANTA) 200-200-20 MG/5ML suspension 30 mL  30 mL Oral Q4H PRN Clapacs, Jeremy Jeremy [name], Jeremy       benztropine (COGENTIN) tablet 0.5 mg  0.5 mg Oral Daily Clapacs, Jeremy Jeremy [name], Jeremy   0.5 mg at 07/04/22 [id number]  haloperidol (HALDOL) tablet 4 mg  4 mg Oral BID Clapacs, Jeremy Jeremy [name], Jeremy   4 mg at 07/04/22 0845   hydrOXYzine (ATARAX) tablet 25 mg  25 mg Oral TID PRN Vanetta Mulders, NP   25 mg at 07/02/22 0800   hydrOXYzine (ATARAX) tablet 50 mg  50 mg Oral QHS Sarina Ill, DO   50 mg at 07/03/22 2127   magnesium hydroxide (MILK OF MAGNESIA) suspension 30 mL  30 mL Oral Daily PRN Clapacs, Jeremy Jeremy [name], Jeremy        Lab Results: No results found for this or any previous visit (from the past 48 hour(s)).  Blood Alcohol level:  Lab Results  Component Value Date   ETH <10 06/29/2022   ETH <10 02/06/2022    Metabolic Disorder Labs: Lab Results  Component Value Date   HGBA1C 5.6 07/01/2022   MPG 114.02  07/01/2022   MPG 114 12/23/2021   No results found for: "PROLACTIN" Lab Results  Component Value Date   CHOL 191 07/01/2022   TRIG 81 07/01/2022   HDL 49 07/01/2022   CHOLHDL 3.9 07/01/2022   VLDL 16 07/01/2022   LDLCALC 126 (H) 07/01/2022   LDLCALC 94 12/23/2021    Physical Findings: AIMS:  , ,  ,  ,    CIWA:    COWS:     Musculoskeletal: Strength & Muscle Tone: within normal limits Gait & Station: normal Patient leans: N/A  Psychiatric Specialty Exam:  Presentation  General Appearance:  Appropriate for Environment  Eye Contact: Fair  Speech: Clear and Coherent  Speech Volume: Normal  Handedness:No data recorded  Mood and Affect  Mood: Depressed  Affect: Congruent   Thought Process  Thought Processes: Coherent  Descriptions of Associations:Intact  Orientation:Full (Time, Place and Person)  Thought Content:WDL  History of Schizophrenia/Schizoaffective disorder:No  Duration of Psychotic Symptoms:Less than six months  Hallucinations:No data recorded Ideas of Reference:Paranoia; Delusions  Suicidal Thoughts:No data recorded Homicidal Thoughts:No data recorded  Sensorium  Memory: Immediate Fair  Judgment: Fair  Insight: Poor   Executive Functions  Concentration: Poor  Attention Span: Fair  Recall: [employer] of Knowledge: Fair  Language: Fair   Psychomotor Activity  Psychomotor Activity:No data recorded  Assets  Assets: Physical Health; Resilience; Financial Resources/Insurance; Desire for Improvement   Sleep  Sleep:No data recorded    Blood pressure 98/82, pulse 77, temperature 98 F (36.7 C), temperature source Oral, resp. rate 18, height 5\' 9"  (1.753 m), weight 98 kg, SpO2 98 %. Body mass index is 31.9 kg/m.   Treatment Plan Summary: Daily contact with patient to assess and evaluate symptoms and progress in treatment, Medication management, and Plan continue current medications.  Brisha Mccabe , DO 07/04/2022, 11:12 AM

## 2022-07-04 NOTE — Progress Notes (Addendum)
No distress noted denies SI/HI/AVH, in a safe condition at this time, 15 minutes safety checks will continue to monitor

## 2022-07-05 ENCOUNTER — Other Ambulatory Visit: Payer: Self-pay

## 2022-07-05 DIAGNOSIS — F19959 Other psychoactive substance use, unspecified with psychoactive substance-induced psychotic disorder, unspecified: Secondary | ICD-10-CM | POA: Diagnosis not present

## 2022-07-05 MED ORDER — HALOPERIDOL 2 MG PO TABS: 4.0000 mg | ORAL_TABLET | Freq: Two times a day (BID) | ORAL | 1 refills | Status: DC

## 2022-07-05 MED ORDER — BENZTROPINE MESYLATE 0.5 MG PO TABS
0.5000 mg | ORAL_TABLET | Freq: Every day | ORAL | 0 refills | Status: DC
  Filled 2022-07-05: qty 10, 10d supply, fill #0

## 2022-07-05 MED ORDER — HALOPERIDOL 2 MG PO TABS
4.0000 mg | ORAL_TABLET | Freq: Two times a day (BID) | ORAL | 0 refills | Status: DC
  Filled 2022-07-05: qty 40, 10d supply, fill #0

## 2022-07-05 MED ORDER — BENZTROPINE MESYLATE 0.5 MG PO TABS: 0.5000 mg | ORAL_TABLET | Freq: Every day | ORAL | 1 refills | Status: DC

## 2022-07-05 NOTE — Group Note (Signed)
[facility] LCSW Group Therapy Note    Group Date: 07/05/2022 Start Time: 1300 End Time: 1400  Type of Therapy and Topic:  Group Therapy:  Overcoming Obstacles  Participation Level:  [facility] PARTICIPATION LEVEL: Active   Description of Group:   In this group patients will be encouraged to explore what they see as obstacles to their own wellness and recovery. They will be guided to discuss their thoughts, feelings, and behaviors related to these obstacles. The group will process together ways to cope with barriers, with attention given to specific choices patients can make. Each patient will be challenged to identify changes they are motivated to make in order to overcome their obstacles. This group will be process-oriented, with patients participating in exploration of their own experiences as well as giving and receiving support and challenge from other group members.  Therapeutic Goals: 1. Patient will identify personal and current obstacles as they relate to admission. 2. Patient will identify barriers that currently interfere with their wellness or overcoming obstacles.  3. Patient will identify feelings, thought process and behaviors related to these barriers. 4. Patient will identify two changes they are willing to make to overcome these obstacles:    Summary of Patient Progress Patient was present for the entirety of the group process. He was involved in the discussion, identifying impatience and disappointment as obstacles that he needs to overcome. Pt shared that he needs to connect with his support system and engage with community resources to work towards his goal. He appeared open and receptive to feedback/comments from both peers and facilitator.    Therapeutic Modalities:   Cognitive Behavioral Therapy Solution Focused Therapy Motivational Interviewing Relapse Prevention Therapy   Shirl Harris, LCSW

## 2022-07-05 NOTE — Progress Notes (Signed)
Recreation Therapy Notes   Date: 07/05/2022  Time: 10:45 am   Location: Craft room       Behavioral response: N/A   Intervention Topic: Values   Discussion/Intervention: Patient refused to attend group.   Clinical Observations/Feedback:  Patient refused to attend group.    Zyan Coby LRT/CTRS        Terriah Reggio 07/05/2022 11:20 AM

## 2022-07-05 NOTE — Progress Notes (Signed)
DISCHARGE NOTE:  Patient denies SI/HI/AVH at this time. Discharge instructions, AVS, prescriptions, medication supply and transition record reviewed over with patient. Patient agrees to comply with medication management, follow-up visit, and outpatient therapy. Patient belongings including red pocket knife returned to patient. Patient questions and concerns addressed and answered. Patient ambulatory off unit. Patient discharged to home with his mother.

## 2022-07-05 NOTE — [facility] Counselor (Addendum)
CSW met with pt briefly to discuss discharge. Pt endorsed plans to go to his mother's upon discharge and stated that he has a ride. Pt and CSW discussed his follow up appointment on Wednesday, 07/07/22 briefly. He voiced understanding regarding his appointment as him and Lanae Boast had already discussed that he would be picked up at 6:45AM. Pt denied any use of tobacco products or need for cessation. He endorsed some use of substances in the past couple of weeks and stated that he needs to get some treatment but hopefully that will happen through [place]. CSW asked if pt needed anything else. He stated that he needed a copy of the boarding house list. CSW agreed to give him this. No other concerns expressed. Contact ended without incident.   Boarding house list and housing/shelter resource list attached to pt discharge paperwork.   Chalmers Guest. Guerry Bruin, MSW, LCSW, [place] 07/05/2022 2:26 PM

## 2022-07-05 NOTE — BHH Suicide Risk Assessment (Signed)
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[name])   Cocaine abuse ([place])   Total Time spent with patient: 30 minutes  Musculoskeletal: Strength & Muscle Tone: within normal limits Gait & Station: normal Patient leans: N/A  Psychiatric Specialty Exam  Presentation  General Appearance:  Appropriate for Environment  Eye Contact: Fair  Speech: Clear and Coherent  Speech Volume: Normal  Handedness:No data recorded  Mood and Affect  Mood: Depressed  Duration of Depression Symptoms: No data recorded Affect: Congruent   Thought Process  Thought Processes: Coherent  Descriptions of Associations:Intact  Orientation:Full (Time, Place and Person)  Thought Content:WDL  History of Schizophrenia/Schizoaffective disorder:No  Duration of Psychotic Symptoms:Less than six months  Hallucinations:No data recorded Ideas of Reference:Paranoia; Delusions  Suicidal Thoughts:No data recorded Homicidal Thoughts:No data recorded  Sensorium  Memory: Immediate Fair  Judgment: Fair  Insight: Poor   Executive Functions  Concentration: Poor  Attention Span: Fair  Recall: [employer] of Knowledge: Fair  Language: Fair   Psychomotor Activity  Psychomotor Activity:No data recorded  Assets  Assets: Physical Health; Resilience; Financial Resources/Insurance; Desire for Improvement   Sleep  Sleep:No data recorded  Physical Exam: Physical Exam Vitals and nursing note reviewed.  Constitutional:      Appearance: Normal appearance.  HENT:     Head: Normocephalic and atraumatic.     Mouth/Throat:     Pharynx: Oropharynx is clear.  Eyes:     Pupils: Pupils are equal, round, and reactive to light.  Cardiovascular:     Rate and  Rhythm: Normal rate and regular rhythm.  Pulmonary:     Effort: Pulmonary effort is normal.     Breath sounds: Normal breath sounds.  Abdominal:     General: Abdomen is flat.     Palpations: Abdomen is soft.  Musculoskeletal:        General: Normal range of motion.  Skin:    General: Skin is warm and dry.  Neurological:     General: No focal deficit present.     Mental Status: He is alert. Mental status is at baseline.  Psychiatric:        Attention and Perception: Attention normal.        Mood and Affect: Mood normal.        Speech: Speech normal.        Behavior: Behavior normal.        Thought Content: Thought content normal.        Cognition and Memory: Cognition normal.    Review of Systems  Constitutional: Negative.   HENT: Negative.    Eyes: Negative.   Respiratory: Negative.    Cardiovascular: Negative.   Gastrointestinal: Negative.   Musculoskeletal: Negative.   Skin: Negative.   Neurological: Negative.   Psychiatric/Behavioral: Negative.     Blood pressure 108/68, pulse 64, temperature 98.1 F (36.7 C), temperature source Oral, resp. rate 18, height 5\' 9"  (1.753 m), weight 98 kg, SpO2 98 %. Body mass index is 31.9 kg/m.  Mental Status Per Nursing Assessment::   On Admission:  Suicidal ideation indicated by patient  Demographic Factors:  Male and Unemployed  Loss Factors: Financial problems/change in socioeconomic status  Historical Factors: Impulsivity  Risk Reduction Factors:   Sense of responsibility to family  Continued Clinical Symptoms:  He has a sample from the pharmacy that I just  Cognitive Features ThNoneat Contribute To Risk:      Suicide Risk:  Minimal: No identifiable suicidal ideation.  Patients presenting with no risk factors but with morbid ruminations; may be classified as minimal risk based on the severity of the depressive symptoms   Follow-up Information     [place] [facility] Follow up.   Why: Lanae Boast, peer support  specialist, will be picking you up for appointment on Wednesday, 07/07/22 at 6:45am. Thanks! Contact information: [place] [address] [phone]                 Plan Of Care/Follow-up recommendations:  Other:  Patient no longer reporting any hallucinations or psychotic symptoms or suicidal ideation.  Recommend outpatient follow-up through [facility] and other treatment facilities and continued medication management  Alethia Berthold, MD 07/05/2022, 2:40 PM

## 2022-07-05 NOTE — Progress Notes (Signed)
Patient alert  and oriented x 4, affect is blunted, brightens upon approach by staff, denies denies SI/HI/AVH interacting appropriately with peers and staff. Patient appears less anxious, visible in the milieu and complaint with medication regimen. 15 minutes safety checks maintained will continue to monitor.

## 2022-07-05 NOTE — Discharge Summary (Signed)
Physician Discharge Summary Note  Patient:  Jeremy [name] is an 61 y.o., male MRN:  [id number] DOB:  04-19-1961 Patient phone:  [phone] (home)  Patient address:   [place] [address],  Total Time spent with patient: 30 minutes  Date of Admission:  06/30/2022 Date of Discharge: 07/05/2022  Reason for Admission: Patient was admitted because of presentation in the emergency room with suicidal ideation and hallucinations dysphoric mood relapse into substances lack of social support  Principal Problem: Psychoactive substance-induced psychosis [facility]) Discharge Diagnoses: Principal Problem:   Psychoactive substance-induced psychosis ([place]) Active Problems:   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 No pertinent surgical history. Family History: History reviewed. No pertinent family history. Family Psychiatric  History: See previous Social History:  Social History   Substance and Sexual Activity  Alcohol Use Yes   Comment: per pt he drinks a pint daily     Social History   Substance and Sexual Activity  Drug Use Yes   Types: Cocaine    Social History   Socioeconomic History   Marital status: Single    Spouse name: Not on file   Number of children: Not on file   Years of education: Not on file   Highest education level: Not on file  Occupational History   Not on file  Tobacco Use   Smoking status: Never   Smokeless tobacco: Never  Vaping Use   Vaping Use: Never used  Substance and Sexual Activity   Alcohol use: Yes    Comment: per pt he drinks a pint daily   Drug use: Yes    Types: Cocaine   Sexual activity: Not Currently  Other  Topics Concern   Not on file  Social History Narrative   Not on file   Social Determinants of Health   Financial Resource Strain: Not on file  Food Insecurity: Unknown (06/30/2022)   Hunger Vital Sign    Worried About Running Out of Food in the Last Year: Patient refused    Ran Out of Food in the Last Year: Patient refused  Transportation Needs: Unknown (06/30/2022)   PRAPARE - Hydrologist (Medical): Patient refused    Lack of Transportation (Non-Medical): Patient refused  Physical Activity: Not on file  Stress: Not on file  Social Connections: Not on file    Hospital Course: Admitted to psychiatric hospital.  15-minute checks continued.  Haloperidol dose initially at old doses increased though to 4 mg twice a day.  Patient got a good night sleep for a few nights reports that mood is much better.  Hallucinations are under control.  Denies any suicidal thought.  Able to make good eye contact and converse lucidly.  Met with treatment team and agrees to follow up for substance abuse and mental health treatment.  Physical Findings: AIMS:  , ,  ,  ,    CIWA:    COWS:     Musculoskeletal: Strength & Muscle Tone: within normal limits Gait & Station: normal Patient leans: N/A   Psychiatric Specialty Exam:  Presentation  General Appearance:  Appropriate for Environment  Eye Contact: Fair  Speech: Clear and Coherent  Speech Volume: Normal  Handedness:No data recorded  Mood and Affect  Mood: Depressed  Affect: Congruent   Thought Process  Thought Processes: Coherent  Descriptions of Associations:Intact  Orientation:Full (Time, Place and Person)  Thought Content:WDL  History of Schizophrenia/Schizoaffective disorder:No  Duration of Psychotic Symptoms:Less than six months  Hallucinations:No data recorded Ideas of Reference:Paranoia; Delusions  Suicidal Thoughts:No data recorded Homicidal Thoughts:No data  recorded  Sensorium  Memory: Immediate Fair  Judgment: Fair  Insight: Poor   Executive Functions  Concentration: Poor  Attention Span: Fair  Recall: [place] of Knowledge: Fair  Language: Fair   Psychomotor Activity  Psychomotor Activity:No data recorded  Assets  Assets: Physical Health; Resilience; Financial Resources/Insurance; Desire for Improvement   Sleep  Sleep:No data recorded   Physical Exam: Physical Exam Vitals reviewed.  Constitutional:      Appearance: Normal appearance.  HENT:     Head: Normocephalic and atraumatic.     Mouth/Throat:     Pharynx: Oropharynx is clear.  Eyes:     Pupils: Pupils are equal, round, and reactive to light.  Cardiovascular:     Rate and Rhythm: Normal rate and regular rhythm.  Pulmonary:     Effort: Pulmonary effort is normal.     Breath sounds: Normal breath sounds.  Abdominal:     General: Abdomen is flat.     Palpations: Abdomen is soft.  Musculoskeletal:        General: Normal range of motion.  Skin:    General: Skin is warm and dry.  Neurological:     General: No focal deficit present.     Mental Status: He is alert. Mental status is at baseline.  Psychiatric:        Attention and Perception: Attention normal.        Mood and Affect: Mood normal.        Speech: Speech normal.        Behavior: Behavior is cooperative.        Thought Content: Thought content normal.        Cognition and Memory: Cognition normal.    Review of Systems  Constitutional: Negative.   HENT: Negative.    Eyes: Negative.   Respiratory: Negative.    Cardiovascular: Negative.   Gastrointestinal: Negative.   Musculoskeletal: Negative.   Skin: Negative.   Neurological: Negative.   Psychiatric/Behavioral: Negative.     Blood pressure 108/68, pulse 64, temperature 98.1 F (36.7 C), temperature source Oral, resp. rate 18, height 5' 9"  (1.753 m), weight 98 kg, SpO2 98 %. Body mass index is 31.9 kg/m.   Social  History   Tobacco Use  Smoking Status Never  Smokeless Tobacco Never   Tobacco Cessation:  N/A, patient does not currently use tobacco products   Blood Alcohol level:  Lab Results  Component Value Date   ETH <10 06/29/2022   ETH <10 77/93/9030    Metabolic Disorder Labs:  Lab Results  Component Value Date   HGBA1C 5.6 07/01/2022   MPG 114.02 07/01/2022   MPG 114 12/23/2021   No results found for: "PROLACTIN" Lab Results  Component Value Date   CHOL 191 07/01/2022   TRIG 81 07/01/2022   HDL 49 07/01/2022   CHOLHDL 3.9 07/01/2022   VLDL 16 07/01/2022   LDLCALC 126 (H) 07/01/2022   [place] 94 12/23/2021    See Psychiatric Specialty Exam and Suicide Risk Assessment completed by Attending Physician prior to discharge.  Discharge destination:  Home  Is patient on multiple antipsychotic therapies at discharge:  No  Has Patient had three or more failed trials of antipsychotic monotherapy by history:  No  Recommended Plan for Multiple Antipsychotic Therapies: NA   Allergies as of 07/05/2022   No Known Allergies          Follow-up Information     [facility] Follow up.   Why: Lanae Boast, peer support specialist, will be picking you up for appointment on Wednesday, 07/07/22 at 6:45am. Thanks! Contact information: [place] [address] [phone]                 Follow-up recommendations:  Other:  Recommend outpatient follow-up mental health treatment with [facility] to focus on substance use and mental health problems.  Prescriptions and supply of medicine provided  Comments: See above  Signed: Alethia Berthold, MD 07/05/2022, 2:42 PM

## 2022-07-05 NOTE — Progress Notes (Signed)
  [facility] Adult Case Management Discharge Plan :  Will you be returning to the same living situation after discharge:  No. At discharge, do you have transportation home?: Yes,  pt support system to provide transportation. Do you have the ability to pay for your medications: No.  Release of information consent forms completed and in the chart;  Patient's signature needed at discharge.  Patient to Follow up at:  Follow-up Information     [place] Follow up.   Why: Lanae Boast, peer support specialist, will be picking you up for appointment on Wednesday, 07/07/22 at 6:45am. Thanks! Contact information: [place] [address] [phone]                 Next level of care provider has access to [place] and Suicide Prevention discussed: Yes,  SPE completed with pt.  Have you used any form of tobacco in the last 30 days? (Cigarettes, Smokeless Tobacco, Cigars, and/or Pipes): No  Has patient been referred to the Quitline?: N/A patient is not a smoker  Patient has been referred for addiction treatment: Yes  Shirl Harris, LCSW 07/05/2022, 2:26 PM

## 2022-07-19 ENCOUNTER — Other Ambulatory Visit: Payer: Self-pay

## 2022-07-19 MED ORDER — BENZTROPINE MESYLATE 0.5 MG PO TABS
0.5000 mg | ORAL_TABLET | Freq: Every day | ORAL | 1 refills | Status: AC
  Filled 2022-07-19: qty 30, 30d supply, fill #0

## 2022-07-19 MED ORDER — HALOPERIDOL 2 MG PO TABS
4.0000 mg | ORAL_TABLET | Freq: Two times a day (BID) | ORAL | 1 refills | Status: DC
  Filled 2022-07-19: qty 129, 32d supply, fill #0

## 2022-07-23 ENCOUNTER — Other Ambulatory Visit: Payer: Self-pay

## 2022-09-27 ENCOUNTER — Other Ambulatory Visit: Payer: Self-pay

## 2022-10-22 ENCOUNTER — Ambulatory Visit
Admission: EM | Admit: 2022-10-22 | Discharge: 2022-10-22 | Disposition: A | Discharge status: Home / Self Care | Payer: Medicaid Other | Attending: Emergency Medicine | Admitting: Emergency Medicine

## 2022-10-22 ENCOUNTER — Ambulatory Visit (INDEPENDENT_AMBULATORY_CARE_PROVIDER_SITE_OTHER): Payer: Medicaid Other

## 2022-10-22 DIAGNOSIS — M25561 Pain in right knee: Secondary | ICD-10-CM | POA: Diagnosis not present

## 2022-10-22 DIAGNOSIS — M1711 Unilateral primary osteoarthritis, right knee: Secondary | ICD-10-CM | POA: Diagnosis not present

## 2022-10-22 MED ORDER — MELOXICAM 15 MG PO TABS: 15.0000 mg | ORAL_TABLET | Freq: Every day | ORAL | 0 refills | Status: DC

## 2022-10-22 MED ORDER — KETOROLAC TROMETHAMINE 30 MG/ML IJ SOLN
30.0000 mg | Freq: Once | INTRAMUSCULAR | Status: AC
  Administered 2022-10-22: 30 mg via INTRAMUSCULAR

## 2022-10-22 MED ORDER — CYCLOBENZAPRINE HCL 10 MG PO TABS: 10.0000 mg | ORAL_TABLET | Freq: Every day | ORAL | 0 refills | Status: DC

## 2022-10-22 NOTE — Discharge Instructions (Addendum)
X-ray shows that you have arthritis throughout your knee joint, this is an inflammatory process that will come and go, typically will cause pain and swelling, more information is in your packet about this condition  You have been given an injection of Toradol today here in the office to help reduce pain and help reduce swelling, daily will take 30 minutes to go into effect  Starting tomorrow you may use meloxicam every morning as needed, take for at least 5 days before using as needed  You have been given a knee brace here in the office to provide stability and support, may use when completing activity when symptoms are flared  You may use ice or heat over the affected area in 10 to 15-minute intervals  You may elevate your leg when sitting and lying to help reduce swelling  If your symptoms continue to persist or worsen you may follow-up with your primary doctor or orthopedic specialist, information is listed on front page

## 2022-10-22 NOTE — ED Provider Notes (Signed)
Jeremy [name]    CSN: [id number] Arrival date & time: 10/22/22  1131      History   Chief Complaint Chief Complaint  Patient presents with   Knee Pain    R    HPI Jeremy [name] is a 62 y.o. male.   Patient presents for evaluation of right knee pain beginning 3 weeks ago.  Was walking when he heard a popping syndrome and pain has been present since.  Pain is worsened with walking and standing, does not dissipate but improves at rest.  Pain is primarily to the anterior of the knee, noticed swelling today but unsure if it had been there prior.  Associated numbness and tingling to the anterior of the knee.  Has attempted use of over-the-counter analgesics intermittently without relief.  Denies injury or trauma.  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 No pertinent surgical history.     Home Medications    Prior to Admission medications   Medication Sig Start Date End Date Taking? Authorizing Provider  benztropine (COGENTIN) 0.5 MG tablet Take 1 tablet (0.5 mg total) by mouth daily. 07/05/22  Yes Clapacs, Madie [place], MD  cyclobenzaprine (FLEXERIL) 10 MG tablet Take 1 tablet (10 mg total) by mouth at bedtime. 10/22/22  Yes Debarah Mccumbers R, NP  haloperidol (HALDOL) 2 MG tablet Take 2 tablets (4 mg total) by mouth 2 (two) times daily. 07/05/22  Yes Clapacs, Madie Reno, MD  meloxicam (MOBIC) 15 MG tablet Take 1 tablet (15 mg total) by mouth daily. 10/22/22  Yes Hans Eden, NP    Family History History reviewed. No pertinent family history.  Social History Social History    Tobacco Use   Smoking status: Never   Smokeless tobacco: Never  Vaping Use   Vaping Use: Never used  Substance Use Topics   Alcohol use: Yes    Comment: per pt he drinks a pint daily   Drug use: Yes    Types: Cocaine     Allergies   Patient has no known allergies.   Review of Systems Review of Systems   Physical Exam Triage Vital Signs ED Triage Vitals  Enc Vitals Group     BP 10/22/22 1154 106/68     Pulse Rate 10/22/22 1154 86     Resp 10/22/22 1154 20     Temp 10/22/22 1154 97.7 F (36.5 C)     Temp Source 10/22/22 1154 Oral     SpO2 10/22/22 1154 96 %     Weight --      Height --      Head Circumference --      Peak Flow --      Pain Score 10/22/22 1148 8     Pain Loc --      Pain Edu? --      Excl. in [place]? --    No data found.  Updated Vital Signs BP 106/68 (BP Location: Left Arm)   Pulse 86   Temp 97.7 F (36.5 C) (Oral)   Resp 20   SpO2 96%   Visual Acuity Right Eye Distance:   Left  Eye Distance:   Bilateral Distance:    Right Eye Near:   Left Eye Near:    Bilateral Near:     Physical Exam Constitutional:      Appearance: Normal appearance.  Eyes:     Extraocular Movements: Extraocular movements intact.  Pulmonary:     Effort: Pulmonary effort is normal.  Musculoskeletal:     Comments: Noted moderate swelling noticed present to the anterior of the right knee with associated tenderness, no point tenderness noted, 2+ popliteal pulse, able to bear weight, range of motion intact  Neurological:     Mental Status: He is alert and oriented to person, place, and time. Mental status is at baseline.      UC Treatments / Results  Labs (all labs ordered are listed, but only abnormal results are displayed) Labs Reviewed - No data to display  EKG   Radiology DG Knee Complete 4 Views Right  Result Date: 10/22/2022 CLINICAL DATA:  Right knee pain for 3 weeks. No known injury. Felt a pop while walking. Limited range of motion. EXAM: RIGHT  KNEE - COMPLETE 4+ VIEW COMPARISON:  None Available. FINDINGS: No acute fracture or dislocation. There is mild tricompartmental osteoarthritis with peripheral spurring and joint space narrowing. Small knee joint effusion. No erosive change or focal bone abnormalities. Mild soft tissue edema. IMPRESSION: Mild tricompartmental osteoarthritis with small knee joint effusion. No acute bony abnormality. Electronically Signed   By: Keith Rake M.D.   On: 10/22/2022 12:14    Procedures Procedures (including critical care time)  Medications Ordered in UC Medications  ketorolac (TORADOL) 30 MG/ML injection 30 mg (has no administration in time range)    Initial Impression / Assessment and Plan / UC Course  I have reviewed the triage vital signs and the nursing notes.  Pertinent labs & imaging results that were available during my care of the patient were reviewed by me and considered in my medical decision making (see chart for details).  Acute pain of right knee, primary osteoarthritis of right knee  Arthritis confirmed via x-ray, discussed with patient, mild effusion noted most likely will be reabsorbed, advised to monitor for increased swelling, Toradol injection given in office and prescribed meloxicam and Flexeril for outpatient use, knee sleeve applied by nursing staff, neurovascularly intact before and after placement, advised to be used as needed when completing activities, recommend ice and heat over the affected area in 10 to 15-minute intervals, recommended elevation, if symptoms persist or recur advised to follow-up with PCP or orthopedic doctor, walking referral given Final Clinical Impressions(s) / UC Diagnoses   Final diagnoses:  Acute pain of right knee  Primary osteoarthritis of right knee     Discharge Instructions      X-ray shows that you have arthritis throughout your knee joint, this is an inflammatory process that will come and go, typically will cause pain and swelling,  more information is in your packet about this condition  You have been given an injection of Toradol today here in the office to help reduce pain and help reduce swelling, daily will take 30 minutes to go into effect  Starting tomorrow you may use meloxicam every morning as needed, take for at least 5 days before using as needed  You have been given a knee brace here in the office to provide stability and support, may use when completing activity when symptoms are flared  You may use ice or heat over the affected area in 10 to 15-minute intervals  You may elevate your leg when sitting and lying to help reduce swelling  If your symptoms continue to persist or worsen you may follow-up with your primary doctor or orthopedic specialist, information is listed on front page   ED Prescriptions     Medication Sig Dispense Auth. Provider   meloxicam (MOBIC) 15 MG tablet Take 1 tablet (15 mg total) by mouth daily. 30 tablet Alphonse Asbridge R, NP   cyclobenzaprine (FLEXERIL) 10 MG tablet Take 1 tablet (10 mg total) by mouth at bedtime. 10 tablet Hans Eden, NP      PDMP not reviewed this encounter.   Hans Eden, NP 10/22/22 1250

## 2022-10-22 NOTE — ED Triage Notes (Signed)
R knee pain after he heard a click while walking three weeks ago.  Swelling this morning. Has not used RICE consistently.

## 2022-12-28 ENCOUNTER — Ambulatory Visit (INDEPENDENT_AMBULATORY_CARE_PROVIDER_SITE_OTHER): Payer: Medicaid Other | Admitting: Nurse Practitioner

## 2022-12-28 ENCOUNTER — Encounter: Payer: Self-pay | Admitting: Nurse Practitioner

## 2022-12-28 VITALS — BP 124/68 | HR 68 | Temp 97.9°F | Ht 69.0 in | Wt 224.0 lb

## 2022-12-28 DIAGNOSIS — G8929 Other chronic pain: Secondary | ICD-10-CM | POA: Diagnosis not present

## 2022-12-28 DIAGNOSIS — Z1159 Encounter for screening for other viral diseases: Secondary | ICD-10-CM

## 2022-12-28 DIAGNOSIS — F209 Schizophrenia, unspecified: Secondary | ICD-10-CM

## 2022-12-28 DIAGNOSIS — Z13220 Encounter for screening for lipoid disorders: Secondary | ICD-10-CM

## 2022-12-28 DIAGNOSIS — D518 Other vitamin B12 deficiency anemias: Secondary | ICD-10-CM

## 2022-12-28 DIAGNOSIS — Z1329 Encounter for screening for other suspected endocrine disorder: Secondary | ICD-10-CM

## 2022-12-28 DIAGNOSIS — Z0000 Encounter for general adult medical examination without abnormal findings: Secondary | ICD-10-CM | POA: Diagnosis not present

## 2022-12-28 DIAGNOSIS — M25561 Pain in right knee: Secondary | ICD-10-CM | POA: Diagnosis not present

## 2022-12-28 DIAGNOSIS — Z1211 Encounter for screening for malignant neoplasm of colon: Secondary | ICD-10-CM

## 2022-12-28 DIAGNOSIS — M25511 Pain in right shoulder: Secondary | ICD-10-CM

## 2022-12-28 DIAGNOSIS — Z125 Encounter for screening for malignant neoplasm of prostate: Secondary | ICD-10-CM | POA: Diagnosis not present

## 2022-12-28 DIAGNOSIS — Z114 Encounter for screening for human immunodeficiency virus [HIV]: Secondary | ICD-10-CM

## 2022-12-28 LAB — COMPREHENSIVE METABOLIC PANEL
ALT: 17 U/L (ref 0–53)
AST: 16 U/L (ref 0–37)
Albumin: 3.9 g/dL (ref 3.5–5.2)
Alkaline Phosphatase: 66 U/L (ref 39–117)
BUN: 17 mg/dL (ref 6–23)
CO2: 27 mEq/L (ref 19–32)
Calcium: 9 mg/dL (ref 8.4–10.5)
Chloride: 107 mEq/L (ref 96–112)
Creatinine, Ser: 1.04 mg/dL (ref 0.40–1.50)
GFR: 78.68 mL/min (ref >60.00)
Glucose, Bld: 99 mg/dL (ref 70–99)
Potassium: 4.5 mEq/L (ref 3.5–5.1)
Sodium: 140 mEq/L (ref 135–145)
Total Bilirubin: 0.4 mg/dL (ref 0.2–1.2)
Total Protein: 6.2 g/dL (ref 6.0–8.3)

## 2022-12-28 LAB — CBC WITH DIFFERENTIAL/PLATELET
Basophils Absolute: 0 10*3/uL (ref 0.0–0.1)
Basophils Relative: 0.8 % (ref 0.0–3.0)
Eosinophils Absolute: 0.1 10*3/uL (ref 0.0–0.7)
Eosinophils Relative: 2.1 % (ref 0.0–5.0)
HCT: 45.4 % (ref 39.0–52.0)
Hemoglobin: 15.1 g/dL (ref 13.0–17.0)
Lymphocytes Relative: 23.9 % (ref 12.0–46.0)
Lymphs Abs: 1.3 10*3/uL (ref 0.7–4.0)
MCHC: 33.4 g/dL (ref 30.0–36.0)
MCV: 94.6 fl (ref 78.0–100.0)
Monocytes Absolute: 0.6 10*3/uL (ref 0.1–1.0)
Monocytes Relative: 10.5 % (ref 3.0–12.0)
Neutro Abs: 3.5 10*3/uL (ref 1.4–7.7)
Neutrophils Relative %: 62.7 % (ref 43.0–77.0)
Platelets: 226 10*3/uL (ref 150.0–400.0)
RBC: 4.8 Mil/uL (ref 4.22–5.81)
RDW: 14.9 % (ref 11.5–15.5)
WBC: 5.6 10*3/uL (ref 4.0–10.5)

## 2022-12-28 LAB — LIPID PANEL
Cholesterol: 169 mg/dL (ref 0–200)
HDL: 39.7 mg/dL (ref >39.00)
LDL Cholesterol: 108 mg/dL — ABNORMAL HIGH (ref 0–99)
NonHDL: 129.63
Total CHOL/HDL Ratio: 4
Triglycerides: 106 mg/dL (ref 0.0–149.0)
VLDL: 21.2 mg/dL (ref 0.0–40.0)

## 2022-12-28 LAB — HEMOGLOBIN A1C: Hgb A1c MFr Bld: 5.8 % (ref 4.6–6.5)

## 2022-12-28 LAB — TSH: TSH: 1.05 u[IU]/mL (ref 0.35–5.50)

## 2022-12-28 LAB — VITAMIN B12: Vitamin B-12: 101 pg/mL — ABNORMAL LOW (ref 211–911)

## 2022-12-28 LAB — PSA: PSA: 1.63 ng/mL (ref 0.10–4.00)

## 2022-12-28 NOTE — Assessment & Plan Note (Signed)
Physical exam complete. Lab work as outlined. Will contact patient with results. Colon- due, patient interested in Cologuard. Order placed, encouraged patient to complete when it arrives. PSA- today. Declined flu vaccine and additional COVID vaccines. Tetanus vaccine UTD. Interested in the Shingles vaccine, information provided to patient, encouraged to return to get this whenever he would like or get at his local pharmacy. HIV/Hep C screenings today. Recommended establishing with Ophthalmology for annual exam and follow up with Dentist. Encouraged healthy diet and exercise.

## 2022-12-28 NOTE — Assessment & Plan Note (Signed)
He is unsure of what medications he is on. He reports doing well currently. Denies hallucinations or delusions. Follow up with Psychiatry.

## 2022-12-28 NOTE — Assessment & Plan Note (Addendum)
Hx of right rotator cuff injury. Will refer to Ortho. Encouraged to ice/heat, Tylenol/Ibuprofen for pain.

## 2022-12-28 NOTE — Assessment & Plan Note (Signed)
Will check CBC and B12 level today. Not currently taking a supplement.

## 2022-12-28 NOTE — Assessment & Plan Note (Addendum)
Will refer to Ortho for further evaluation. Encouraged to continue ice/heat, elevation, Tylenol/Ibuprofen for pain.

## 2022-12-28 NOTE — Progress Notes (Signed)
Bethanie DickerKacy Shamyah Stantz, NP-C Phone: [phone]  Jeremy EtienneRodney C [name] is a 62 y.o. male who presents today to establish care and for annual exam. He is followed by Psychiatry.   He reports right knee and shoulder pain. He was seen by Urgent Care in February for right knee pain. His x-ray at that time showed osteoarthritis and a small joint effusion. He continues to have knee pain. Reports it feels unstable. He does elevate and ice it in the evenings. He has been taking Tylenol/Ibuprofen without relief in symptoms. He also has a Hx of a right rotator cuff injury. He reports his shoulder pain has recently worsened at night, he is unable to lay on his right side. He can feel and hear popping and clicking in his shoulder. He feels like his shoulder locks up.   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 No pertinent family history.  Social History   Socioeconomic History   Marital status: Single    Spouse name: Not on file   Number of children: Not on file   Years of education: Not on file   Highest education level: Not on file  Occupational History   Not on file  Tobacco Use   Smoking status: Never   Smokeless tobacco: Never  Vaping Use   Vaping Use: Never used  Substance and Sexual Activity   Alcohol use: Yes    Comment: per pt he drinks a pint daily   Drug use: Yes    Types: Cocaine   Sexual activity: Not Currently  Other Topics Concern   Not on file  Social History Narrative   Not on file   Social Determinants of Health   Financial Resource Strain: Not on file  Food Insecurity: Patient Declined (06/30/2022)   Hunger Vital Sign    Worried About Running Out of Food in the Last Year: Patient declined    Ran Out of Food in the Last Year: Patient declined  Transportation Needs: Patient Declined (06/30/2022)   PRAPARE - Administrator, Civil ServiceTransportation    Lack of Transportation (Medical): Patient declined    Lack of Transportation (Non-Medical): Patient declined  Physical Activity: Not on file  Stress: Not on file  Social Connections: Not on file  Intimate Partner Violence: Unknown (06/30/2022)   Humiliation, Afraid, Rape, and Kick questionnaire    Fear of Current or Ex-Partner: No    Emotionally Abused: No    Physically Abused: No  Sexually Abused: Patient declined    ROS  General:  Negative for unexplained weight loss, fever Skin: Negative for new or changing mole, sore that won't heal HEENT: Negative for trouble hearing, trouble seeing, ringing in ears, mouth sores, hoarseness, change in voice, dysphagia. CV:  Negative for chest pain, dyspnea, edema, palpitations Resp: Negative for cough, dyspnea, hemoptysis GI: Negative for nausea, vomiting, diarrhea, constipation, abdominal pain, melena, hematochezia. GU: Negative for dysuria, incontinence,  urinary hesitance, hematuria, vaginal or penile discharge, polyuria, sexual difficulty, lumps in testicle or breasts MSK: Negative for muscle cramps or aches Neuro: Negative for headaches, weakness, numbness, dizziness, passing out/fainting Psych: Negative for depression, memory problems  Objective  Physical Exam Vitals:   12/28/22 1314  BP: 124/68  Pulse: 68  Temp: 97.9 F (36.6 C)  SpO2: 97%    BP Readings from Last 3 Encounters:  12/28/22 124/68  10/22/22 106/68  06/30/22 112/64   Wt Readings from Last 3 Encounters:  12/28/22 224 lb (101.6 kg)  06/29/22 220 lb 0.3 oz (99.8 kg)  03/03/22 220 lb (99.8 kg)    Physical Exam Constitutional:      General: He is not in acute distress.    Appearance: Normal appearance.  HENT:     Head: Normocephalic.     Right Ear: Tympanic membrane normal.     Left Ear: Tympanic membrane normal.     Nose: Nose normal.     Mouth/Throat:     Mouth: Mucous membranes are moist.     Pharynx: Oropharynx is clear.  Eyes:     Conjunctiva/sclera: Conjunctivae normal.     Pupils: Pupils are equal, round, and reactive to light.  Neck:     Thyroid: No thyromegaly.  Cardiovascular:     Rate and Rhythm: Normal rate and regular rhythm.     Heart sounds: Normal heart sounds.  Pulmonary:     Effort: Pulmonary effort is normal.     Breath sounds: Normal breath sounds.  Abdominal:     General: Abdomen is flat. Bowel sounds are normal.     Palpations: Abdomen is soft. There is no mass.     Tenderness: There is no abdominal tenderness.  Musculoskeletal:     Right shoulder: No swelling or tenderness. Decreased range of motion. Normal strength.     Left shoulder: Normal.     Right knee: Swelling (minimal) present. Normal range of motion. No tenderness.     Left knee: Normal.  Lymphadenopathy:     Cervical: No cervical adenopathy.  Skin:    General: Skin is warm and dry.     Findings: No rash.  Neurological:     General: No focal deficit  present.     Mental Status: He is alert.  Psychiatric:        Mood and Affect: Mood normal.        Speech: Speech normal.        Behavior: Behavior normal.        Thought Content: Thought content normal.    Assessment/Plan:   Preventative health care Assessment & Plan: Physical exam complete. Lab work as outlined. Will contact patient with results. Colon- due, patient interested in Cologuard. Order placed, encouraged patient to complete when it arrives. PSA- today. Declined flu vaccine and additional COVID vaccines. Tetanus vaccine UTD. Interested in the Shingles vaccine, information provided to patient, encouraged to return to get this whenever he would like or get at his local pharmacy. HIV/Hep C screenings today. Recommended establishing with Ophthalmology  for annual exam and follow up with Dentist. Encouraged healthy diet and exercise.   Orders: -     Comprehensive metabolic panel -     CBC with Differential/Platelet -     Hemoglobin A1c  Acute pain of right knee Assessment & Plan: Will refer to Ortho for further evaluation. Encouraged to continue ice/heat, elevation, Tylenol/Ibuprofen for pain.   Orders: -     Ambulatory referral to Orthopedic Surgery  Chronic right shoulder pain Assessment & Plan: Hx of right rotator cuff injury. Will refer to Ortho. Encouraged to ice/heat, Tylenol/Ibuprofen for pain.   Orders: -     Ambulatory referral to Orthopedic Surgery  Schizophrenia, unspecified type Assessment & Plan: He is unsure of what medications he is on. He reports doing well currently. Denies hallucinations or delusions. Follow up with Psychiatry.    Macrocytic anemia with vitamin B12 deficiency Assessment & Plan: Will check CBC and B12 level today. Not currently taking a supplement.   Orders: -     Vitamin B12  Thyroid disorder screen -     TSH  Lipid screening -     Lipid panel  Screening PSA (prostate specific antigen) -     PSA  Encounter for screening  for HIV -     HIV Antibody (routine testing w rflx)  Encounter for hepatitis C screening test for low risk patient -     Hepatitis C antibody  Screen for colon cancer -     Cologuard   Return in about 1 year (around 12/28/2023), or if symptoms worsen or fail to improve.   Bethanie Dicker, NP-C [facility] - [employer]

## 2022-12-29 LAB — HIV ANTIBODY (ROUTINE TESTING W REFLEX): HIV 1&2 Ab, 4th Generation: NONREACTIVE

## 2022-12-29 LAB — HEPATITIS C ANTIBODY: Hepatitis C Ab: NONREACTIVE

## 2023-01-11 LAB — COLOGUARD: COLOGUARD: NEGATIVE

## 2023-01-14 ENCOUNTER — Telehealth: Payer: Self-pay

## 2023-01-14 NOTE — Telephone Encounter (Signed)
Called to speak with pt in regards to labs a lady answered and I asked to speak to the pt after identifying myself, she stated he was not home at the moment and she stated she would let him know that is primary care office is trying to get in touch with him.

## 2023-01-25 ENCOUNTER — Emergency Department
Admission: EM | Admit: 2023-01-25 | Discharge: 2023-01-25 | Disposition: A | Discharge status: Home / Self Care | Payer: Medicaid Other | Attending: Emergency Medicine | Admitting: Emergency Medicine

## 2023-01-25 ENCOUNTER — Emergency Department: Payer: Medicaid Other

## 2023-01-25 DIAGNOSIS — J069 Acute upper respiratory infection, unspecified: Secondary | ICD-10-CM | POA: Diagnosis not present

## 2023-01-25 DIAGNOSIS — R059 Cough, unspecified: Secondary | ICD-10-CM | POA: Diagnosis present

## 2023-01-25 DIAGNOSIS — Z20822 Contact with and (suspected) exposure to covid-19: Secondary | ICD-10-CM | POA: Diagnosis not present

## 2023-01-25 LAB — RESP PANEL BY RT-PCR (RSV, FLU A&B, COVID)  RVPGX2
Influenza A by PCR: NEGATIVE
Influenza B by PCR: NEGATIVE
Resp Syncytial Virus by PCR: POSITIVE — AB
SARS Coronavirus 2 by RT PCR: NEGATIVE

## 2023-01-25 MED ORDER — PSEUDOEPHEDRINE HCL 60 MG PO TABS: 60.0000 mg | ORAL_TABLET | Freq: Three times a day (TID) | ORAL | 0 refills | Status: DC | PRN

## 2023-01-25 MED ORDER — ACETAMINOPHEN 500 MG PO TABS
1000.0000 mg | ORAL_TABLET | Freq: Once | ORAL | Status: AC
  Administered 2023-01-25: 1000 mg via ORAL
  Filled 2023-01-25: qty 2

## 2023-01-25 NOTE — ED Notes (Signed)
COVID swab sent to lab.

## 2023-01-25 NOTE — ED Triage Notes (Signed)
Pt arrives via EMS for body aches with cough and congestion.

## 2023-01-25 NOTE — ED Provider Notes (Signed)
[facility] Emergency Department Provider Note     Event Date/Time   First MD Initiated Contact with Patient 01/25/23 1854     (approximate)   History   Generalized Body Aches   HPI  Jeremy [name] is a 62 y.o. male with a medical history of substance abuse and schizophrenia presents to the emergency department with moderate nasal congestion, cough, and body aches for 2 days.  Associated symptoms include sore throat, headache and fever.  He reports known exposure to sick contacts.  He has tried Tylenol and DayQuil without much relief.  He reports having a history of pneumonia and current symptoms are similar.  He denies recent travel, and shortness of breath     Physical Exam   Triage Vital Signs: ED Triage Vitals [01/25/23 1643]  Enc Vitals Group     BP 112/74     Pulse Rate 71     Resp 18     Temp 98.8 F (37.1 C)     Temp Source Oral     SpO2 94 %     Weight 228 lb (103.4 kg)     Height      Head Circumference      Peak Flow      Pain Score 10     Pain Loc      Pain Edu?      Excl. in GC?     Most recent vital signs: Vitals:   01/25/23 1643 01/25/23 2208  BP: 112/74 102/74  Pulse: 71 76  Resp: 18 16  Temp: 98.8 F (37.1 C)   SpO2: 94% 95%   General: Uncomfortable.  Alert and oriented. INAD.  Skin:  Mild diaphoresis noted.  No rashes or lesions noted.     Head:  NCAT.  Eyes:  PERRLA. EOMI. Conjunctivae clear. Ears:  EACs patent w/o inflammation. TMs translucent. No bulging, erythema, or effusion.  Nose:   Unilateral rhinorrhea. Mouth/Throat No tonsillar swelling or exudates. No erythema. Neck:   Supple. No lymphadenopathy.  CV:  Good peripheral perfusion. RRR.  RESP:  Normal effort. LCTAB.  ABD:  No distention. Soft. No tenderness to palpation. .      ED Results / Procedures / Treatments   Labs (all labs ordered are listed, but only abnormal results are displayed) Labs Reviewed  RESP PANEL BY RT-PCR (RSV, FLU A&B,  COVID)  [id number] - Abnormal; Notable for the following components:      Result Value   Resp Syncytial Virus by PCR POSITIVE (*)    All other components within normal limits     RADIOLOGY  I personally viewed and evaluated these images as part of my medical decision making, as well as reviewing the written report by the radiologist.  ED Provider Interpretation: Chest x-ray unremarkable, no interstitial findings noted.  DG Chest 1 View  Result Date: 01/25/2023 CLINICAL DATA:  Cough.  Shortness of breath. EXAM: CHEST  1 VIEW COMPARISON:  March 03, 2022. FINDINGS: The heart size and mediastinal contours are within normal limits. Both lungs are clear. The visualized skeletal structures are unremarkable. IMPRESSION: No active disease. Electronically Signed   By: Lupita Raider M.D.   On: 01/25/2023 19:53     PROCEDURES:  Critical Care performed: No  Procedures   MEDICATIONS ORDERED IN ED: Medications  acetaminophen (TYLENOL) tablet 1,000 mg (1,000 mg Oral Given 01/25/23 1951)    IMPRESSION / MDM / ASSESSMENT AND PLAN / ED COURSE  I reviewed  the triage vital signs and the nursing notes.                              Differential diagnosis includes, but is not limited to, influenza, COVID, RSV, URI, acute bronchitis, pneumonia, COPD doubt but considered.   Patient's presentation is most consistent with acute complicated illness / injury requiring diagnostic workup.  62 year old male presents to the emergency department with acute onset of cough and congestion.  With his history of pneumonia a chest x-ray is ordered.  Results are reassuring.  Vital signs stable and within normal limits. Respiratory panel detected RSV. Patient educated on viral illnesses and course.  He is advised and encouraged of supportive care for symptoms.  Encouraged to increase fluids to stay hydrated. Patient's diagnosis is consistent with URI, etiology of RSV. Patient will be discharged home with prescriptions for  Sudafed for congestion. Patient is to follow up with primary care in 3 days. Patient is given ED precautions to return to the ED for any worsening or new symptoms.  Clinical Course as of 01/26/23 0013  Tue Jan 25, 2023  2028 Resp panel by RT-PCR (RSV, Flu A&B, Covid) Anterior Nasal Swab [MH]    Clinical Course User Index [MH] Romeo Apple, Kashaun Bebo A, PA-C    FINAL CLINICAL IMPRESSION(S) / ED DIAGNOSES   Final diagnoses:  Viral upper respiratory tract infection     Rx / DC Orders   ED Discharge Orders          Ordered    pseudoephedrine (SUDAFED) 60 MG tablet  Every 8 hours PRN        01/25/23 2155             Note:  This document was prepared using Dragon voice recognition software and may include unintentional dictation errors.    Romeo Apple, Aidenn Skellenger A, PA-C 01/26/23 0019    Minna Antis, MD 01/27/23 0600

## 2023-01-27 ENCOUNTER — Telehealth: Payer: Self-pay

## 2023-01-27 NOTE — Transitions of Care (Post Inpatient/ED Visit) (Signed)
Unable to reach pt by phone and v/m is not available.      01/27/2023  Name: Jeremy [name] MRN: [id number] DOB: 02-25-1961  Today's TOC FU Call Status: Today's TOC FU Call Status:: Unsuccessul Call (1st Attempt) Unsuccessful Call (1st Attempt) Date: 01/27/23  Attempted to reach the patient regarding the most recent Inpatient/ED visit.  Follow Up Plan: Additional outreach attempts will be made to reach the patient to complete the Transitions of Care (Post Inpatient/ED visit) call.   Signature Lewanda Rife, LPN  Current Outpatient Medications on File Prior to Visit  Medication Sig Dispense Refill   ARIPiprazole (ABILIFY) 10 MG tablet Take 10 mg by mouth daily.     benztropine (COGENTIN) 0.5 MG tablet Take 1 tablet (0.5 mg total) by mouth daily. 30 tablet 1   cyclobenzaprine (FLEXERIL) 10 MG tablet Take 1 tablet (10 mg total) by mouth at bedtime. 10 tablet 0   hydrOXYzine (ATARAX) 25 MG tablet Take 25-50 mg by mouth at bedtime as needed for anxiety.     meloxicam (MOBIC) 15 MG tablet Take 1 tablet (15 mg total) by mouth daily. 30 tablet 0   pseudoephedrine (SUDAFED) 60 MG tablet Take 1 tablet (60 mg total) by mouth every 8 (eight) hours as needed for congestion. 30 tablet 0   No current facility-administered medications on file prior to visit.

## 2023-01-28 NOTE — Transitions of Care (Post Inpatient/ED Visit) (Signed)
lady answered phone (no DPR signed) and pt was not there and I asked if pt would call [phone] and lady said would relay message to pt.      01/28/2023  Name: Jeremy [name] MRN: [id number] DOB: [birth year]  Today's TOC FU Call Status: Today's TOC FU Call Status:: Unsuccessful Call (2nd Attempt) Unsuccessful Call (1st Attempt) Date: 01/27/23 Unsuccessful Call (2nd Attempt) Date: 01/28/23  Attempted to reach the patient regarding the most recent Inpatient/ED visit.  Follow Up Plan: Additional outreach attempts will be made to reach the patient to complete the Transitions of Care (Post Inpatient/ED visit) call.   Signature Lewanda Rife, LPN

## 2023-12-28 ENCOUNTER — Encounter: Payer: MEDICAID | Admitting: Nurse Practitioner

## 2024-06-28 ENCOUNTER — Telehealth: Payer: Self-pay | Admitting: Nurse Practitioner

## 2024-06-28 ENCOUNTER — Encounter: Payer: Self-pay | Admitting: Nurse Practitioner

## 2024-06-28 NOTE — Telephone Encounter (Signed)
 Unable to leave a voice message to call office to reschedule appointment. Letter sent out.  E2C2 please reschedule appointment. NO TOC's  on 12/24/225 or 09/19/2024, sick visits on those days only.

## 2024-06-30 ENCOUNTER — Emergency Department: Payer: MEDICAID

## 2024-06-30 ENCOUNTER — Other Ambulatory Visit: Payer: Self-pay

## 2024-06-30 ENCOUNTER — Encounter: Payer: Self-pay | Admitting: Emergency Medicine

## 2024-06-30 ENCOUNTER — Emergency Department
Admission: EM | Admit: 2024-06-30 | Discharge: 2024-06-30 | Disposition: A | Discharge status: Home / Self Care | Payer: MEDICAID | Attending: Emergency Medicine | Admitting: Emergency Medicine

## 2024-06-30 DIAGNOSIS — R55 Syncope and collapse: Secondary | ICD-10-CM | POA: Diagnosis not present

## 2024-06-30 DIAGNOSIS — W19XXXA Unspecified fall, initial encounter: Secondary | ICD-10-CM | POA: Insufficient documentation

## 2024-06-30 DIAGNOSIS — S0081XA Abrasion of other part of head, initial encounter: Secondary | ICD-10-CM | POA: Diagnosis not present

## 2024-06-30 DIAGNOSIS — F1490 Cocaine use, unspecified, uncomplicated: Secondary | ICD-10-CM | POA: Insufficient documentation

## 2024-06-30 DIAGNOSIS — S0990XA Unspecified injury of head, initial encounter: Secondary | ICD-10-CM | POA: Diagnosis present

## 2024-06-30 LAB — COMPREHENSIVE METABOLIC PANEL WITH GFR
ALT: 19 U/L (ref 0–44)
AST: 35 U/L (ref 15–41)
Albumin: 3.9 g/dL (ref 3.5–5.0)
Alkaline Phosphatase: 49 U/L (ref 38–126)
Anion gap: 13 (ref 5–15)
BUN: 13 mg/dL (ref 6–20)
CO2: 22 mmol/L (ref 22–32)
Calcium: 9.3 mg/dL (ref 8.9–10.3)
Chloride: 104 mmol/L (ref 98–111)
Creatinine, Ser: 1.26 mg/dL — ABNORMAL HIGH (ref 0.61–1.24)
GFR, Estimated: >60 mL/min (ref >60)
Glucose, Bld: 102 mg/dL — ABNORMAL HIGH (ref 70–99)
Potassium: 4.4 mmol/L (ref 3.5–5.1)
Sodium: 139 mmol/L (ref 135–145)
Total Bilirubin: 1 mg/dL (ref 0.0–1.2)
Total Protein: 6.5 g/dL (ref 6.5–8.1)

## 2024-06-30 LAB — CBC WITH DIFFERENTIAL/PLATELET
Abs Immature Granulocytes: 0.02 K/uL (ref 0.00–0.07)
Basophils Absolute: 0 K/uL (ref 0.0–0.1)
Basophils Relative: 1 %
Eosinophils Absolute: 0.1 K/uL (ref 0.0–0.5)
Eosinophils Relative: 1 %
HCT: 41.8 % (ref 39.0–52.0)
Hemoglobin: 14.1 g/dL (ref 13.0–17.0)
Immature Granulocytes: 0 %
Lymphocytes Relative: 22 %
Lymphs Abs: 1 K/uL (ref 0.7–4.0)
MCH: 33.9 pg (ref 26.0–34.0)
MCHC: 33.7 g/dL (ref 30.0–36.0)
MCV: 100.5 fL — ABNORMAL HIGH (ref 80.0–100.0)
Monocytes Absolute: 0.5 K/uL (ref 0.1–1.0)
Monocytes Relative: 11 %
Neutro Abs: 3 K/uL (ref 1.7–7.7)
Neutrophils Relative %: 65 %
Platelets: 210 K/uL (ref 150–400)
RBC: 4.16 MIL/uL — ABNORMAL LOW (ref 4.22–5.81)
RDW: 13.6 % (ref 11.5–15.5)
WBC: 4.7 K/uL (ref 4.0–10.5)
nRBC: 0 % (ref 0.0–0.2)

## 2024-06-30 LAB — TROPONIN I (HIGH SENSITIVITY)
Troponin I (High Sensitivity): 8 ng/L (ref <18)
Troponin I (High Sensitivity): 12 ng/L (ref <18)

## 2024-06-30 LAB — SALICYLATE LEVEL: Salicylate Lvl: <7 mg/dL — ABNORMAL LOW (ref 7.0–30.0)

## 2024-06-30 LAB — CK: Total CK: 220 U/L (ref 49–397)

## 2024-06-30 LAB — MAGNESIUM: Magnesium: 2 mg/dL (ref 1.7–2.4)

## 2024-06-30 LAB — ACETAMINOPHEN LEVEL: Acetaminophen (Tylenol), Serum: <10 ug/mL — ABNORMAL LOW (ref 10–30)

## 2024-06-30 MED ORDER — ACETAMINOPHEN 500 MG PO TABS
1000.0000 mg | ORAL_TABLET | Freq: Once | ORAL | Status: AC
  Administered 2024-06-30: 1000 mg via ORAL
  Filled 2024-06-30: qty 2

## 2024-06-30 MED ORDER — KETOROLAC TROMETHAMINE 30 MG/ML IJ SOLN
15.0000 mg | Freq: Once | INTRAMUSCULAR | Status: AC
  Administered 2024-06-30: 15 mg via INTRAVENOUS
  Filled 2024-06-30: qty 1

## 2024-06-30 MED ORDER — LACTATED RINGERS IV BOLUS
1000.0000 mL | Freq: Once | INTRAVENOUS | Status: AC
  Administered 2024-06-30: 1000 mL via INTRAVENOUS

## 2024-06-30 NOTE — Discharge Instructions (Signed)
 Return to the ED with any other episodes of passing out

## 2024-06-30 NOTE — ED Triage Notes (Signed)
 Pt to ED via [facility] from home for a fall. Pt states that he does not remember what happened. Pt did hit his head. Pt is not on blood thinners. Pts brother states that pt has hx/o cocaine abuse, unsure if he took something. Pt is in NAD.

## 2024-06-30 NOTE — ED Provider Notes (Signed)
 [facility] Provider Note    Event Date/Time   First MD Initiated Contact with Patient 06/30/24 0303     (approximate)   History   Fall   HPI  Jeremy [name] is a 63 y.o. male who presents to the ED for evaluation of Fall   Review of PCP visit from last year.  History of polysubstance abuse, schizophrenia  Patient presents to the ED via EMS from his brother's house for evaluation of a fall and possible syncopal episode.  Patient questions if he passed out but is not sure.  Reports everything was fine at his brother's house, they were hanging out and I do not know what happened.  History is limited  Physical Exam   Triage Vital Signs: ED Triage Vitals  Encounter Vitals Group     BP 06/30/24 0307 106/70     Girls Systolic BP Percentile --      Girls Diastolic BP Percentile --      Boys Systolic BP Percentile --      Boys Diastolic BP Percentile --      Pulse Rate 06/30/24 0307 64     Resp 06/30/24 0307 16     Temp 06/30/24 0310 97.7 F (36.5 C)     Temp Source 06/30/24 0307 Oral     SpO2 06/30/24 0307 98 %     Weight 06/30/24 0304 227 lb 1.2 oz (103 kg)     Height 06/30/24 0304 5' 9 (1.753 m)     Head Circumference --      Peak Flow --      Pain Score 06/30/24 0304 0     Pain Loc --      Pain Education --      Exclude from Growth Chart --     Most recent vital signs: Vitals:   06/30/24 0700 06/30/24 0702  BP: 124/88   Pulse: (!) 55   Resp: 14 17  Temp:    SpO2: 96%     General: Awake, no distress.  CV:  Good peripheral perfusion.  Resp:  Normal effort.  Abd:  No distention.  Soft and nontender MSK:  No deformity noted.  Active and passive range of motion intact all 4 extremities.  No signs of trauma or deformity Neuro:  No focal deficits appreciated. Other:  Abrasion to the left forehead, some surrounding dried blood without active bleeding or discrete laceration to require suture repair.   ED Results / Procedures /  Treatments   Labs (all labs ordered are listed, but only abnormal results are displayed) Labs Reviewed  CBC WITH DIFFERENTIAL/PLATELET - Abnormal; Notable for the following components:      Result Value   RBC 4.16 (*)    MCV 100.5 (*)    All other components within normal limits  ACETAMINOPHEN  LEVEL - Abnormal; Notable for the following components:   Acetaminophen  (Tylenol ), Serum <10 (*)    All other components within normal limits  SALICYLATE LEVEL - Abnormal; Notable for the following components:   Salicylate Lvl <7.0 (*)    All other components within normal limits  COMPREHENSIVE METABOLIC PANEL WITH GFR - Abnormal; Notable for the following components:   Glucose, Bld 102 (*)    Creatinine, Ser 1.26 (*)    All other components within normal limits  CK  MAGNESIUM   URINALYSIS, ROUTINE W REFLEX MICROSCOPIC  URINE DRUG SCREEN, QUALITATIVE ([facility] ONLY)  TROPONIN I (HIGH SENSITIVITY)  TROPONIN I (HIGH SENSITIVITY)  EKG Sinus rhythm with a rate of 64 bpm.  Normal axis.  QTc 447.  No STEMI.  No comparison EKG.  RADIOLOGY CT head interpreted by me without evidence of acute intracranial pathology CT cervical spine interpreted by me without evidence of fracture or dislocation CXR interpreted by me without evidence of acute cardiopulmonary pathology. Abdominal x-ray interpreted by me with nonspecific , nonobstructive gas throughout the intestines, no free air  Official radiology report(s): DG Abdomen Acute W/Chest Result Date: 06/30/2024 EXAM: UPRIGHT AND SUPINE XRAY VIEWS OF THE ABDOMEN AND FRONTAL VIEW(S) OF THE CHEST 06/30/2024 04:53:00 AM COMPARISON: Portable chest x-ray 06/30/2024 04:33:00 AM. CLINICAL HISTORY: 63 year old male. Evaluation for possible free air beneath the right diaphragm, noted on prior portable chest x-ray. FINDINGS: LUNGS AND PLEURA: No consolidation or pulmonary edema. No pleural effusion or pneumothorax. HEART AND MEDIASTINUM: No acute abnormality of the  cardiac and mediastinal silhouettes. BOWEL: Abundant bowel gas in nondilated small and large bowel loops throughout the abdomen and pelvis. Moderate volume of retained stool in the rectum and the right colon. 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 No bowel obstruction. PERITONEUM AND SOFT TISSUES: No convincing pneumoperitoneum. No abnormal calcifications. BONES: No acute osseous abnormality. IMPRESSION: 1. No pneumoperitoneum identified. 2. Abundant gas in nondilated small and large bowel, Non-obstructed but consider ileus. Electronically signed by: Helayne Hurst MD 06/30/2024 05:10 AM EDT RP Workstation: [id number]   DG Chest Portable 1 View Result Date: 06/30/2024 EXAM: 1 VIEW(S) XRAY OF THE CHEST 06/30/2024 04:05:00 AM COMPARISON: Portable chest x ray 01/25/2023 and earlier. CLINICAL HISTORY: 63 year old male with syncope and fall, head injury, and history of cocaine abuse. FINDINGS: LUNGS AND PLEURA: Lung markings are stable. Evidence of colonic interposition now between the liver and diaphragm in the right abdomen. Questionable abnormal lucency under the right hemidiaphragm. Recommend upright and supine dedicated views of the abdomen to further evaluate. No focal pulmonary opacity. No pulmonary edema. No pleural effusion. No pneumothorax. HEART AND MEDIASTINUM: No acute abnormality of the cardiac and mediastinal silhouettes. BONES AND SOFT TISSUES: Advanced chronic degeneration at the right glenohumeral joint. No acute osseous abnormality. IMPRESSION: 1. Colonic interposition beneath right hemidiaphragm with Questionable abnormal gas lucency - pneumoperitoneum. Recommend upright (or left lateral decubitus if upright not feasible) and supine abdominal radiographs to exclude free air. 2. No acute cardiopulmonary abnormality. Electronically signed by: Helayne Hurst MD 06/30/2024 04:11 AM EDT RP Workstation: [id number]   CT Cervical Spine Wo Contrast Result Date: 06/30/2024 EXAM: CT CERVICAL SPINE  WITHOUT CONTRAST 06/30/2024 03:52:28 AM TECHNIQUE: CT of the cervical spine was performed without the administration of intravenous contrast. Multiplanar reformatted images are provided for review. Automated exposure control, iterative reconstruction, and/or weight based adjustment of the mA/kV was utilized to reduce the radiation dose to as low as reasonably achievable. COMPARISON: Head CT 06/30/2024. CLINICAL HISTORY: 63 year old male with syncope, fall, head injury, and history of cocaine abuse. FINDINGS: CERVICAL SPINE: BONES AND ALIGNMENT: Straightening of cervical lordosis. No acute fracture or traumatic malalignment. DEGENERATIVE CHANGES: Chronic disc and endplate degeneration throughout the cervical spine. But no significant cervical spinal stenosis by CT. SOFT TISSUES: No prevertebral soft tissue swelling. THORACIC OUTLET: Negative visible noncontrast thoracic outlet. IMPRESSION: 1. No acute cervical spine injury identified. 2. Chronic cervical spondylosis. Electronically signed by: Helayne Hurst MD 06/30/2024 04:03 AM EDT RP Workstation: [id number]   CT HEAD WO CONTRAST ( ) Result Date: 06/30/2024 EXAM: CT HEAD WITHOUT CONTRAST 06/30/2024 03:52:28 AM TECHNIQUE: CT of the head was performed without the administration of intravenous  contrast. Automated exposure control, iterative reconstruction, and/or weight based adjustment of the mA/kV was utilized to reduce the radiation dose to as low as reasonably achievable. COMPARISON: None available. CLINICAL HISTORY: 63 year old male with syncope, forehead trauma, and fall with head impact. FINDINGS: BRAIN AND VENTRICLES: No acute hemorrhage. No evidence of acute infarct. No hydrocephalus. No extra-axial collection. No mass effect or midline shift. Brain volume within normal limits for age. Gray white differentiation within normal limits. No suspicious intracranial vascular hyperdensity. Normal for age non contrast CT appearance of the brain. ORBITS: No acute  orbital injury identified. SINUSES: No acute abnormality. SOFT TISSUES AND SKULL: No acute scalp soft tissue injury identified. No skull fracture. IMPRESSION: 1. No acute traumatic injury identified 2. Normal for age Non-contrast CT brain appearance Electronically signed by: Helayne Hurst MD 06/30/2024 03:59 AM EDT RP Workstation: [id number]    PROCEDURES and INTERVENTIONS:  .1-3 Lead EKG Interpretation  Performed by: Claudene Rover, MD Authorized by: Claudene Rover, MD     Interpretation: normal     ECG rate:  66   ECG rate assessment: normal     Rhythm: sinus rhythm     Ectopy: none     Conduction: normal     Medications  lactated ringers bolus 1,000 mL (1,000 mLs Intravenous New Bag/Given 06/30/24 0322)  acetaminophen  (TYLENOL ) tablet 1,000 mg (1,000 mg Oral Given 06/30/24 0609)  ketorolac  (TORADOL ) 30 MG/ML injection 15 mg (15 mg Intravenous Given 06/30/24 0609)     IMPRESSION / MDM / ASSESSMENT AND PLAN / ED COURSE  I reviewed the triage vital signs and the nursing notes.  Differential diagnosis includes, but is not limited to, overdose, polypharmacy, metabolic cephalopathy, cardiac dysrhythmia, seizure, vasovagal episode, assault  {Patient presents with symptoms of an acute illness or injury that is potentially life-threatening.  Patient presents to the ED after a fall and reported possible syncopal episode after recreational cocaine use at his brother's house.  Mildly depressed mental status on arrival that clear/resolves and patient has a generally benign workup.   As below I did discuss with the patient observation admission for possible syncope but he declines and has capacity.  Clinical Course as of 06/30/24 0707  Sat Jun 30, 2024  0533 Reassessed.  Patient seems more awake.  Reports feeling fine without any needs.  Still is not sure what happened [DS]  0616 Reassessed, certainly more awake.  Now providing some additional history that he was using cocaine with his brother  prior to passing out.  Reports that he was fine prior to this.  Denies any chest pain.  Is reporting right shoulder pain that he attributes to his known rotator cuff injury and asking for analgesia for this.  He is adamant about going home, we discussed possible syncopal episode, observation admission and the utility of this.  He declines [DS]    Clinical Course User Index [DS] Claudene Rover, MD     FINAL CLINICAL IMPRESSION(S) / ED DIAGNOSES   Final diagnoses:  Syncope and collapse  Cocaine use     Rx / DC Orders   ED Discharge Orders     None        Note:  This document was prepared using Dragon voice recognition software and may include unintentional dictation errors.   Claudene Rover, MD 06/30/24 [phone]

## 2024-07-16 ENCOUNTER — Ambulatory Visit (INDEPENDENT_AMBULATORY_CARE_PROVIDER_SITE_OTHER): Payer: MEDICAID

## 2024-07-16 ENCOUNTER — Ambulatory Visit (HOSPITAL_COMMUNITY): Payer: Self-pay

## 2024-07-16 ENCOUNTER — Ambulatory Visit
Admission: EM | Admit: 2024-07-16 | Discharge: 2024-07-16 | Disposition: A | Discharge status: Home / Self Care | Payer: MEDICAID

## 2024-07-16 DIAGNOSIS — S42221A 2-part displaced fracture of surgical neck of right humerus, initial encounter for closed fracture: Secondary | ICD-10-CM

## 2024-07-16 DIAGNOSIS — S42214A Unspecified nondisplaced fracture of surgical neck of right humerus, initial encounter for closed fracture: Secondary | ICD-10-CM

## 2024-07-16 DIAGNOSIS — M25511 Pain in right shoulder: Secondary | ICD-10-CM

## 2024-07-16 DIAGNOSIS — M25521 Pain in right elbow: Secondary | ICD-10-CM

## 2024-07-16 NOTE — Patient Instructions (Signed)

## 2024-07-16 NOTE — ED Provider Notes (Signed)
 Jeremy [name]    CSN: [id number] Arrival date & time: 07/16/24  1025      History   Chief Complaint Chief Complaint  Patient presents with   Shoulder Pain    HPI Jeremy [name] is a 63 y.o. male.  Patient presents with right shoulder, upper arm, elbow pain intermittently for several months but worse x 1 week after a fall.  Since his fall 1 week ago, patient has difficulty moving his shoulder.  He has bruising on his upper arm.  No numbness or weakness.  He has been treating his symptoms with Tylenol  and Aleve.  Patient has history of right rotator cuff injury in 2020 which was seen by [facility].  The history is provided by the patient and medical records.    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 No pertinent surgical history.     Home Medications    Prior to Admission medications   Medication Sig Start Date End Date Taking? Authorizing Provider  ABILIFY ASIMTUFII 960 MG/3.2ML PRSY  06/11/24  Yes [provider]  benztropine  (COGENTIN ) 0.5 MG tablet Take 1 tablet (0.5 mg total) by mouth daily. 07/05/22  Yes Clapacs, Norleen DASEN, MD  ARIPiprazole (ABILIFY) 10 MG tablet Take 10 mg by mouth daily. Patient not taking: Reported on 07/16/2024 11/18/22   [provider]  cyclobenzaprine  (FLEXERIL ) 10 MG tablet Take 1 tablet (10 mg total) by mouth  at bedtime. Patient not taking: Reported on 07/16/2024 10/22/22   Teresa Shelba SAUNDERS, NP  hydrOXYzine  (ATARAX ) 25 MG tablet Take 25-50 mg by mouth at bedtime as needed for anxiety. Patient not taking: Reported on 07/16/2024 11/03/22   [provider]  meloxicam  (MOBIC ) 15 MG tablet Take 1 tablet (15 mg total) by mouth daily. Patient not taking: Reported on 07/16/2024 10/22/22   Teresa Shelba SAUNDERS, NP  mirtazapine (REMERON) 7.5 MG tablet Take 7.5 mg by mouth at bedtime. Patient not taking: Reported on 07/16/2024 04/09/24   [provider]  pseudoephedrine  (SUDAFED) 60 MG tablet Take 1 tablet (60 mg total) by mouth every 8 (eight) hours as needed for congestion. Patient not taking: Reported on 07/16/2024 01/25/23   Margrette Rebbeca LABOR, PA-C    Family History History reviewed. No pertinent family history.  Social History Social History   Tobacco Use   Smoking status: Never   Smokeless tobacco: Never  Vaping Use   Vaping status: Never Used  Substance Use Topics   Alcohol use: Yes    Comment: per pt he drinks a pint daily   Drug use: Yes    Types: Cocaine     Allergies   Patient has no known allergies.   Review of Systems Review of Systems  Constitutional:  Negative for chills and fever.  Musculoskeletal:  Positive for arthralgias. Negative for joint swelling.  Skin:  Positive for color change. Negative for rash and wound.  Neurological:  Negative for  weakness and numbness.     Physical Exam Triage Vital Signs ED Triage Vitals  Encounter Vitals Group     BP 07/16/24 1153 113/74     Girls Systolic BP Percentile --      Girls Diastolic BP Percentile --      Boys Systolic BP Percentile --      Boys Diastolic BP Percentile --      Pulse Rate 07/16/24 1153 70     Resp 07/16/24 1153 18     Temp 07/16/24 1153 97.8 F (36.6 C)     Temp src --      SpO2 07/16/24 1153 97 %     Weight --      Height --      Head Circumference --      Peak Flow --      Pain Score  07/16/24 1149 10     Pain Loc --      Pain Education --      Exclude from Growth Chart --    No data found.  Updated Vital Signs BP 113/74   Pulse 70   Temp 97.8 F (36.6 C)   Resp 18   SpO2 97%   Visual Acuity Right Eye Distance:   Left Eye Distance:   Bilateral Distance:    Right Eye Near:   Left Eye Near:    Bilateral Near:     Physical Exam Constitutional:      General: He is not in acute distress. HENT:     Mouth/Throat:     Mouth: Mucous membranes are moist.  Cardiovascular:     Rate and Rhythm: Normal rate.  Pulmonary:     Effort: Pulmonary effort is normal. No respiratory distress.  Musculoskeletal:        General: Tenderness present. No swelling or deformity.     Comments: Right shoulder has generalized tenderness to palpation and limited ROM due to discomfort.  Bruising on right upper arm.  Right elbow mildly tender to palpation but has full range of motion.  2+ radial pulse, sensation intact, strong handgrip, strength 5/5.  Skin:    General: Skin is warm and dry.     Capillary Refill: Capillary refill takes less than 2 seconds.     Findings: Bruising present. No erythema or lesion.  Neurological:     General: No focal deficit present.     Mental Status: He is alert.     Sensory: No sensory deficit.     Motor: No weakness.      UC Treatments / Results  Labs (all labs ordered are listed, but only abnormal results are displayed) Labs Reviewed - No data to display  EKG   Radiology DG Elbow Complete Right Result Date: 07/16/2024 EXAM: 3 VIEW(S) XRAY OF THE RIGHT ELBOW COMPARISON: None available. CLINICAL HISTORY: pain s/p fall. complaints of right sided shoulder pain and elbow pain; ; Reports fall one week ago. States he had a torn rotator cuff approx 3 years ago and it intermittently gives him pain. Now having difficulty moving arm; Best images possible due to the patient limited movement with shoulder and elbow FINDINGS: BONES AND JOINTS: Normal.  SOFT TISSUES: The soft tissues are unremarkable. IMPRESSION: 1. No acute abnormality. Electronically signed by: Waddell Calk MD 07/16/2024 01:52 PM EDT RP Workstation: [id number]   DG Shoulder Right Result Date: 07/16/2024 EXAM: 1 VIEW XRAY OF THE RIGHT SHOULDER 07/16/2024 12:24:51 PM COMPARISON: None available. CLINICAL HISTORY: Pain s/p fall. Complaints of right  sided shoulder pain and elbow pain. Reports fall one week ago. States he had a torn rotator cuff approximately 3 years ago and it intermittently gives him pain. Now having difficulty moving arm. Best images possible due to the patient's limited movement with shoulder and elbow. FINDINGS: BONES AND JOINTS: There is an acute fracture of the surgical neck of the humerus with mild impaction. The glenohumeral joint shows degenerative changes with joint space narrowing and osteophyte formation. No dislocation. The [facility] joint is unremarkable in appearance. SOFT TISSUES: No abnormal calcifications. Visualized lung is unremarkable. LIMITATIONS/ARTIFACTS: Best images possible due to the patient's limited movement with shoulder and elbow. IMPRESSION: 1. Acute  fracture of the surgical neck of the humerus with mild impaction. Electronically signed by: Waddell Calk MD 07/16/2024 01:52 PM EDT RP Workstation: [id number]    Procedures Procedures (including critical care time)  Medications Ordered in UC Medications - No data to display  Initial Impression / Assessment and Plan / UC Course  I have reviewed the triage vital signs and the nursing notes.  Pertinent labs & imaging results that were available during my care of the patient were reviewed by me and considered in my medical decision making (see chart for details).   Right shoulder and elbow pain.  X-ray shows Acute  fracture of the surgical neck of the humerus with mild impaction.  Telephone consult with Dr.Cohen, on-call orthopedist.  Treating with sling, rest arm/shoulder, Tylenol  or ibuprofen ,  follow-up with Ortho.  Contact information for Dr. Iran office provided and instructed patient to call his office this afternoon to schedule an appointment.  ED precautions discussed.  Education provided on humerus fracture.  He agrees to plan of care.  Final Clinical Impressions(s) / UC Diagnoses   Final diagnoses:  Acute pain of right shoulder  Right elbow pain  Closed nondisplaced fracture of surgical neck of right humerus, unspecified fracture morphology, initial encounter     Discharge Instructions      Rest your shoulder.  Wear the sling as directed.  Follow-up with an orthopedist such as the one listed below or your previous orthopedist from [facility].     ED Prescriptions   None    I have reviewed the PDMP during this encounter.   Corlis Burnard DEL, NP 07/16/24 [phone]

## 2024-07-16 NOTE — Progress Notes (Signed)
 Orthopaedic Surgery New Patient Visit   History of Present Illness: The patient is a 63 y.o. male seen in clinic for just over two week history of right shoulder pain s/p fall.   Patient with possible syncopal episode after recreational cocaine use on 06/30/2024.  Witnessed by his brother.  Seen at [facility] ED. Declined admission to the hospital for additional work-up regarding cause of syncopal episode. Patient did report right shoulder pain; however, was attributed to chronic right rotator cuff pain/injury at that time. Has previously completed course of physical therapy. Last seen by [facility] in May 2024. Received subacromial injection.   Patient seen today 07/16/2024 at [facility] for continued right shoulder pain. Now 16 days since fall. Right shoulder xray revealed acute fracture of the surgical neck of the humerus with mild impaction. Placed in shoulder sling. Patient was scheduled to follow-up with [facility].  Patient seen today in our clinic following urgent care referral.  Patient reports today continued pain at anterior and posterior aspect of right shoulder. Resolving ecchymosis on anterior aspect of upper arm. Pain more severe with any shoulder movement. Denies right hand weakness or paresthesias. Patient is right hand dominant.  He reports the sling received earlier today is providing adequate relief.  No previous history of right shoulder fracture. Patient reports possible clavicle fracture when in high school, football injury. Patient regularly followed by PCP, Leron Glance NP with [facility]. Last visit 12/28/2022. Patient also seen by psychiatry for schizophrenia. Unclear at this time who manages, no note found in Epic CareEverywhere. At last PCP visit reported doing well; denied any hallucinations or delusions. Denied cocaine usage at that time. Last positive cocaine test 06/29/2022. Patient states he currently lives with  his brother; history of homelessness. Patient is not currently working.   Past Medical, Social and Family History: Past Medical History:  Diagnosis Date   Schizophrenia (HCC)    No past surgical history on file. No Known Allergies Current Outpatient Medications on File Prior to Visit  Medication Sig Dispense Refill   ABILIFY ASIMTUFII 960 MG/3.2ML PRSY      ARIPiprazole (ABILIFY) 10 MG tablet Take 10 mg by mouth daily. (Patient not taking: Reported on 07/16/2024)     benztropine  (COGENTIN ) 0.5 MG tablet Take 1 tablet (0.5 mg total) by mouth daily. 30 tablet 1   cyclobenzaprine  (FLEXERIL ) 10 MG tablet Take 1 tablet (10 mg total) by mouth at bedtime. (Patient not taking: Reported on 07/16/2024) 10 tablet 0   hydrOXYzine  (ATARAX ) 25 MG tablet Take 25-50 mg by mouth at bedtime as needed for anxiety. (Patient not taking: Reported on 07/16/2024)     meloxicam  (MOBIC ) 15 MG tablet Take 1 tablet (15 mg total) by mouth daily. (Patient not taking: Reported on 07/16/2024) 30 tablet 0   mirtazapine (REMERON) 7.5 MG tablet Take 7.5 mg by mouth at bedtime. (Patient not taking: Reported on 07/16/2024)     pseudoephedrine  (SUDAFED) 60 MG tablet Take 1 tablet (60 mg total) by mouth every 8 (eight) hours as needed for congestion. (Patient not taking: Reported on 07/16/2024) 30 tablet 0   No current facility-administered medications on file prior to visit.   Social History   Tobacco Use   Smoking status: Never   Smokeless tobacco: Never  Vaping Use   Vaping status: Never Used  Substance Use Topics   Alcohol use: Yes    Comment: per pt he drinks a pint daily   Drug use: Yes  Types: Cocaine      I have reviewed past medical, surgical, social and family history, medications and allergies as documented in the EMR.  Review of Systems - A ROS was performed including pertinent positives and negatives as documented in the HPI.     Physical Exam:  General/Constitutional: NAD Vascular: No  edema, swelling or tenderness, except as noted in detailed exam Integumentary: No impressive skin lesions present, except as noted in detailed exam Neuro/Psych: Normal mood and affect, oriented to person, place and time Patient with diagnosis of schizophrenia. Patient having fluid conversation today.   Musculoskeletal: Normal, except as noted in detailed exam and in HPI    Right shoulder/elbow examination (focused):  -Sling intact about the right upper extremity, removed for examination -Compartments soft and compressible about the right upper extremity -Inspection of the shoulder reveals no significant edema, erythema or gross deformity. Resolving ecchymosis on anterior and medial aspect of upper humerus. Mild tenderness with palpation over superior aspect of humerus and humeral head.  -2+ radial pulse, digits are warm and well perfused -Sensation intact to light touch over the radial, ulnar, median, axillary nerve distributions -Shoulder ROM deferred secondary to injury -Right elbow ROM 0 - 110 without pain -Right wrist full ROM; no pain with extension, flexion, radial or ulnar deviation -Able to supinate and pronate forearm without pain     XR Right Shoulder Imaging: X-rays of the Right shoulder including 3-views (internal rotation, Grashey, scapular Y) obtained today 07/16/2024 at [facility] were reviewed personally by me and with patient.  Per my independent interpretation these images show transverse, valgus impacted surgical neck fracture.  Nondisplaced greater tuberosity fragment.  No dislocation of the humeral head. Concentric glenohumeral joint arthritis with loss of joint space.  Degenerative changes noted about the humeral head.    Radiology Read:  Right Shoulder x-ray on 07/16/2024 at [facility]  IMPRESSION: 1. Acute fracture of the surgical neck of the humerus with mild impaction.  Assessment:  Right, valgus impacted  proximal humerus fracture in the setting of glenohumeral arthritis History of cocaine abuse History of schizophrenia  Plan:  Patient was seen and examined in office today. We reviewed patient's history, examination, and imaging in detail. Based on information available for this encounter, patient is just over 2 weeks status post fall presumably resulting in right proximal humerus fracture.  Seen today at urgent care where radiographs revealed proximal humerus fracture.  He was subsequently placed in sling immobilization and referred to our office for evaluation this afternoon.  Review of radiographs reveal impacted but stable fracture pattern with nondisplaced greater tuberosity fragment.  Patient also with underlying glenohumeral osteoarthritis which was being managed conservatively over a several year period by outside orthopedic providers. Discussed fracture and proposed fracture management with patient today.  Based on patient's history and current clinical presentation, we have recommended continuation of conservative management.  Recommended continuous use of sling immobilization with exception for hygiene purposes or pendulum exercises.  He is comfortable today after being placed in the sling. Recommended use of OTC Tylenol  or ibuprofen  for pain relief.  Emphasized the importance of sling immobilization compliance in this early fracture healing period to maintain alignment. I would like patient to see my partner in several weeks for second opinion on management moving forward.  Patient understands and agrees with plan.   All questions, concerns and comments were addressed to the best of my ability.  Follow-up: 2 week follow-up with Dr.  Onesimo for second opinion   Arlyss GEANNIE Schneider, DO Orthopedic Surgery & Sports Medicine [place]   This document was dictated using Dragon voice recognition software. A reasonable attempt at proof reading has been made to minimize errors.

## 2024-07-16 NOTE — ED Triage Notes (Signed)
 Patient to Urgent Care with complaints of right sided shoulder pain.   Reports fall one week ago. States he had a torn rotator cuff approx 3 years ago and it intermittently gives him pain. Now having difficulty moving arm.

## 2024-07-16 NOTE — Discharge Instructions (Addendum)
 Rest your shoulder.  Wear the sling as directed.  Follow-up with an orthopedist such as the one listed below or your previous orthopedist from [facility].

## 2024-07-23 ENCOUNTER — Encounter: Payer: Self-pay | Admitting: Radiology

## 2024-08-02 ENCOUNTER — Ambulatory Visit (INDEPENDENT_AMBULATORY_CARE_PROVIDER_SITE_OTHER): Payer: MEDICAID

## 2024-08-02 ENCOUNTER — Encounter: Payer: Self-pay | Admitting: Orthopedic Surgery

## 2024-08-02 ENCOUNTER — Ambulatory Visit: Payer: MEDICAID | Admitting: Orthopedic Surgery

## 2024-08-02 DIAGNOSIS — S42221D 2-part displaced fracture of surgical neck of right humerus, subsequent encounter for fracture with routine healing: Secondary | ICD-10-CM | POA: Diagnosis not present

## 2024-08-02 DIAGNOSIS — S42221A 2-part displaced fracture of surgical neck of right humerus, initial encounter for closed fracture: Secondary | ICD-10-CM

## 2024-08-02 NOTE — Progress Notes (Signed)
 New Patient Visit  Assessment: Jeremy [name] is a 63 y.o. male with the following: 1. Closed 2-part displaced fracture of surgical neck of right humerus, initial encounter 2.  Right glenohumeral joint arthritis   Plan: Jeremy [name] recently fell and sustained a proximal humerus fracture.  Overall alignment is stable.  There is underlying glenohumeral joint arthritis.  I do think he would benefit from reverse shoulder arthroplasty.  Due to the minimal displacement noted on x-rays, I think it is reasonable for him to continue to heal this fracture, and then we can make plans to proceed with surgical intervention.  Reverse shoulder arthroplasty was discussed briefly in clinic today.  He is interested.  He will need to obtain medical clearance.  I have advised him that if he is interested in considering surgery he should follow-up with his PCP.  In addition, he does have a history of cocaine use, which he states he does not do frequently.  Nonetheless, I have advised him that this cannot happen if he wishes to proceed with surgery.  He states understanding.  Follow-up: Return in about 4 weeks (around 08/30/2024).  Subjective:  Chief Complaint  Patient presents with   right shoulder pain    History of Present Illness: Jeremy [name] is a 63 y.o. male who presents for evaluation of right shoulder pain.  He has previously seen Dr. Gust.  He has right shoulder pain.  He is right-hand dominant.  He is not currently working.  He has been employed as a nutritional therapist for years.  He sustained a fall, and injured of the proximal humerus.  He states he had some pain in that right shoulder prior to the fall.  He is taking Tylenol  currently.  He is coming out of the sling for gentle motion.   Review of Systems: No fevers or chills No numbness or tingling No chest pain No shortness of breath No bowel or bladder dysfunction No GI distress No headaches   Medical History:  Past Medical  History:  Diagnosis Date   Schizophrenia (HCC)     No past surgical history on file.  No family history on file. Social History   Tobacco Use   Smoking status: Never   Smokeless tobacco: Never  Vaping Use   Vaping status: Never Used  Substance Use Topics   Alcohol use: Yes    Comment: per pt he drinks a pint daily   Drug use: Yes    Types: Cocaine    No Known Allergies  Current Meds  Medication Sig   ABILIFY ASIMTUFII 960 MG/3.2ML PRSY    benztropine  (COGENTIN ) 0.5 MG tablet Take 1 tablet (0.5 mg total) by mouth daily.    Objective: There were no vitals taken for this visit.  Physical Exam:  General: Alert and oriented. and No acute distress. Gait: Normal gait.  Right shoulder sling.  Mild tenderness to palpation over the anterior shoulder.  Sensation intact in the axillary nerve distribution.  He is able to abduct the arm.  Fingers warm and well-perfused.  Sensation intact throughout the right hand.  IMAGING: I personally ordered and reviewed the following images   X-rays of the right shoulder were obtained in clinic today.  These are compared to prior x-rays.  Fracture through the surgical neck of the proximal humerus remains a stable alignment.  There has been slight subsidence.  Inferior osteophytes of the humeral head are noted.  Decreased glenohumeral joint space.  There appear to be cyst within  the glenoid.  Impression: Stable right proximal humerus fracture, with underlying glenohumeral joint arthritis   New Medications:  No orders of the defined types were placed in this encounter.     Oneil DELENA Horde, MD  08/02/2024 8:56 AM

## 2024-08-02 NOTE — Patient Instructions (Signed)
 Gentle motion of the shoulder  Sling when you leave the house  Discussed reverse shoulder replacement, can discuss in more detail when you return in 3-4 weeks.

## 2024-08-30 ENCOUNTER — Ambulatory Visit: Payer: MEDICAID | Admitting: Orthopedic Surgery

## 2024-09-19 ENCOUNTER — Encounter: Payer: MEDICAID | Admitting: Nurse Practitioner

## 2025-09-19 ENCOUNTER — Encounter: Payer: MEDICAID | Admitting: Nurse Practitioner
